# Patient Record
Sex: Male | Born: 1945 | Race: White | Hispanic: No | Marital: Married | Smoking: Former smoker
Health system: Southern US, Community
[De-identification: ages and names within clinical notes are randomized; demographics above are authoritative.]

## PROBLEM LIST (undated history)

## (undated) DIAGNOSIS — Z87442 Personal history of urinary calculi: Secondary | ICD-10-CM

## (undated) DIAGNOSIS — M1990 Unspecified osteoarthritis, unspecified site: Secondary | ICD-10-CM

## (undated) DIAGNOSIS — E119 Type 2 diabetes mellitus without complications: Secondary | ICD-10-CM

## (undated) DIAGNOSIS — I10 Essential (primary) hypertension: Secondary | ICD-10-CM

## (undated) DIAGNOSIS — C801 Malignant (primary) neoplasm, unspecified: Secondary | ICD-10-CM

## (undated) DIAGNOSIS — I499 Cardiac arrhythmia, unspecified: Secondary | ICD-10-CM

## (undated) HISTORY — PX: COLONOSCOPY: SHX174

## (undated) HISTORY — PX: CARDIAC CATHETERIZATION: SHX172

---

## 2010-02-16 ENCOUNTER — Ambulatory Visit: Payer: Self-pay | Admitting: Unknown Physician Specialty

## 2013-04-01 ENCOUNTER — Other Ambulatory Visit (HOSPITAL_COMMUNITY): Payer: Self-pay | Admitting: Urology

## 2013-04-01 DIAGNOSIS — C61 Malignant neoplasm of prostate: Secondary | ICD-10-CM

## 2013-04-11 ENCOUNTER — Ambulatory Visit (HOSPITAL_COMMUNITY)
Admission: RE | Admit: 2013-04-11 | Discharge: 2013-04-11 | Disposition: A | Discharge status: Home / Self Care | Payer: Medicare Other | Source: Ambulatory Visit | Attending: Urology | Admitting: Urology

## 2013-04-11 DIAGNOSIS — C61 Malignant neoplasm of prostate: Secondary | ICD-10-CM | POA: Insufficient documentation

## 2013-04-11 LAB — POCT I-STAT CREATININE: Creatinine, Ser: 0.9 mg/dL (ref 0.50–1.35)

## 2013-04-11 MED ORDER — GADOBENATE DIMEGLUMINE 529 MG/ML IV SOLN
17.0000 mL | Freq: Once | INTRAVENOUS | Status: AC | PRN
  Administered 2013-04-11: 17 mL via INTRAVENOUS

## 2013-05-06 ENCOUNTER — Other Ambulatory Visit: Payer: Self-pay | Admitting: Urology

## 2013-05-26 ENCOUNTER — Encounter (HOSPITAL_COMMUNITY): Payer: Self-pay | Admitting: Pharmacy Technician

## 2013-05-27 NOTE — Patient Instructions (Signed)
Jacob Young  05/27/2013   Your procedure is scheduled on:  06/05/13  1115am-215pm  Report to [facility] at      [place] AM.  Call this number if you have problems the morning of surgery: [phone]   Remember:   Do not eat food or drink liquids after midnight.   Take these medicines the morning of surgery with A SIP OF WATER:    Do not wear jewelry,   Do not wear lotions, powders, or perfumes..  . Men may shave face and neck.  Do not bring valuables to the hospital.  Contacts, dentures or bridgework may not be worn into surgery.  Leave suitcase in the car. After surgery it may be brought to your room.  For patients admitted to the hospital, checkout time is 11:00 AM the day of  discharge.      [name] - Preparing for Surgery Before surgery, you can play an important role.  Because skin is not sterile, your skin needs to be as free of germs as possible.  You can reduce the number of germs on your skin by washing with CHG (chlorahexidine gluconate) soap before surgery.  CHG is an antiseptic cleaner which kills germs and bonds with the skin to continue killing germs even after washing. Please DO NOT use if you have an allergy to CHG or antibacterial soaps.  If your skin becomes reddened/irritated stop using the CHG and inform your nurse when you arrive at Short Stay. Do not shave (including legs and underarms) for at least 48 hours prior to the first CHG shower.  You may shave your face. Please follow these instructions carefully:  1.  Shower with CHG Soap the night before surgery and the  morning of Surgery.  2.  If you choose to wash your hair, wash your hair first as usual with your  normal  shampoo.  3.  After you shampoo, rinse your hair and body thoroughly to remove the  shampoo.                           4.  Use CHG as you would any other liquid soap.  You can apply chg directly  to the skin and wash                       Gently with a scrungie or clean  washcloth.  5.  Apply the CHG Soap to your body ONLY FROM THE NECK DOWN.   Do not use on open                           Wound or open sores. Avoid contact with eyes, ears mouth and genitals (private parts).                        Genitals (private parts) with your normal soap.             6.  Wash thoroughly, paying special attention to the area where your surgery  will be performed.  7.  Thoroughly rinse your body with warm water from the neck down.  8.  DO NOT shower/wash with your normal soap after using and rinsing off  the CHG Soap.                9.  Pat yourself dry with a clean towel.  10.  Wear clean pajamas.            11.  Place clean sheets on your bed the night of your first shower and do not  sleep with pets. Day of Surgery : Do not apply any lotions/deodorants the morning of surgery.  Please wear clean clothes to the hospital/surgery center.  FAILURE TO FOLLOW THESE INSTRUCTIONS MAY RESULT IN THE CANCELLATION OF YOUR SURGERY PATIENT SIGNATURE_________________________________  NURSE SIGNATURE__________________________________  ________________________________________________________________________  WHAT IS A BLOOD TRANSFUSION? Blood Transfusion Information  A transfusion is the replacement of blood or some of its parts. Blood is made up of multiple cells which provide different functions.  Red blood cells carry oxygen and are used for blood loss replacement.  White blood cells fight against infection.  Platelets control bleeding.  Plasma helps clot blood.  Other blood products are available for specialized needs, such as hemophilia or other clotting disorders. BEFORE THE TRANSFUSION  Who gives blood for transfusions?   Healthy volunteers who are fully evaluated to make sure their blood is safe. This is blood bank blood. Transfusion therapy is the safest it has ever been in the practice of medicine. Before blood is taken from a donor, a complete history is  taken to make sure that person has no history of diseases nor engages in risky social behavior (examples are intravenous drug use or sexual activity with multiple partners). The donor's travel history is screened to minimize risk of transmitting infections, such as malaria. The donated blood is tested for signs of infectious diseases, such as HIV and hepatitis. The blood is then tested to be sure it is compatible with you in order to minimize the chance of a transfusion reaction. If you or a relative donates blood, this is often done in anticipation of surgery and is not appropriate for emergency situations. It takes many days to process the donated blood. RISKS AND COMPLICATIONS Although transfusion therapy is very safe and saves many lives, the main dangers of transfusion include:   Getting an infectious disease.  Developing a transfusion reaction. This is an allergic reaction to something in the blood you were given. Every precaution is taken to prevent this. The decision to have a blood transfusion has been considered carefully by your caregiver before blood is given. Blood is not given unless the benefits outweigh the risks. AFTER THE TRANSFUSION  Right after receiving a blood transfusion, you will usually feel much better and more energetic. This is especially true if your red blood cells have gotten low (anemic). The transfusion raises the level of the red blood cells which carry oxygen, and this usually causes an energy increase.  The nurse administering the transfusion will monitor you carefully for complications. HOME CARE INSTRUCTIONS  No special instructions are needed after a transfusion. You may find your energy is better. Speak with your caregiver about any limitations on activity for underlying diseases you may have. SEEK MEDICAL CARE IF:   Your condition is not improving after your transfusion.  You develop redness or irritation at the intravenous (IV) site. SEEK IMMEDIATE  MEDICAL CARE IF:  Any of the following symptoms occur over the next 12 hours:  Shaking chills.  You have a temperature by mouth above 102 F (38.9 C), not controlled by medicine.  Chest, back, or muscle pain.  People around you feel you are not acting correctly or are confused.  Shortness of breath or difficulty breathing.  Dizziness and fainting.  You get a rash or develop  hives.  You have a decrease in urine output.  Your urine turns a dark color or changes to pink, red, or brown. Any of the following symptoms occur over the next 10 days:  You have a temperature by mouth above 102 F (38.9 C), not controlled by medicine.  Shortness of breath.  Weakness after normal activity.  The white part of the eye turns yellow (jaundice).  You have a decrease in the amount of urine or are urinating less often.  Your urine turns a dark color or changes to pink, red, or brown. Document Released: 12/31/1999 Document Revised: 03/27/2011 Document Reviewed: 08/19/2007 ExitCare Patient Information 2014 [place].  _______________________________________________________________________  Incentive Spirometer  An incentive spirometer is a tool that can help keep your lungs clear and active. This tool measures how well you are filling your lungs with each breath. Taking long deep breaths may help reverse or decrease the chance of developing breathing (pulmonary) problems (especially infection) following:  A long period of time when you are unable to move or be active. BEFORE THE PROCEDURE   If the spirometer includes an indicator to show your best effort, your nurse or respiratory therapist will set it to a desired goal.  If possible, sit up straight or lean slightly forward. Try not to slouch.  Hold the incentive spirometer in an upright position. INSTRUCTIONS FOR USE  1. Sit on the edge of your bed if possible, or sit up as far as you can in bed or on a chair. 2. Hold the  incentive spirometer in an upright position. 3. Breathe out normally. 4. Place the mouthpiece in your mouth and seal your lips tightly around it. 5. Breathe in slowly and as deeply as possible, raising the piston or the ball toward the top of the column. 6. Hold your breath for 3-5 seconds or for as long as possible. Allow the piston or ball to fall to the bottom of the column. 7. Remove the mouthpiece from your mouth and breathe out normally. 8. Rest for a few seconds and repeat Steps 1 through 7 at least 10 times every 1-2 hours when you are awake. Take your time and take a few normal breaths between deep breaths. 9. The spirometer may include an indicator to show your best effort. Use the indicator as a goal to work toward during each repetition. 10. After each set of 10 deep breaths, practice coughing to be sure your lungs are clear. If you have an incision (the cut made at the time of surgery), support your incision when coughing by placing a pillow or rolled up towels firmly against it. Once you are able to get out of bed, walk around indoors and cough well. You may stop using the incentive spirometer when instructed by your caregiver.  RISKS AND COMPLICATIONS  Take your time so you do not get dizzy or light-headed.  If you are in pain, you may need to take or ask for pain medication before doing incentive spirometry. It is harder to take a deep breath if you are having pain. AFTER USE  Rest and breathe slowly and easily.  It can be helpful to keep track of a log of your progress. Your caregiver can provide you with a simple table to help with this. If you are using the spirometer at home, follow these instructions: [place] IF:   You are having difficultly using the spirometer.  You have trouble using the spirometer as often as instructed.  Your  pain medication is not giving enough relief while using the spirometer.  You develop fever of 100.5 F (38.1 C) or  higher. SEEK IMMEDIATE MEDICAL CARE IF:   You cough up bloody sputum that had not been present before.  You develop fever of 102 F (38.9 C) or greater.  You develop worsening pain at or near the incision site. MAKE SURE YOU:   Understand these instructions.  Will watch your condition.  Will get help right away if you are not doing well or get worse. Document Released: 05/15/2006 Document Revised: 03/27/2011 Document Reviewed: 07/16/2006 [facility] Patient Information 2014 [place], [place].   ________________________________________________________________________

## 2013-05-28 ENCOUNTER — Encounter (INDEPENDENT_AMBULATORY_CARE_PROVIDER_SITE_OTHER): Payer: Self-pay

## 2013-05-28 ENCOUNTER — Encounter (HOSPITAL_COMMUNITY): Payer: Self-pay

## 2013-05-28 ENCOUNTER — Encounter (HOSPITAL_COMMUNITY)
Admission: RE | Admit: 2013-05-28 | Discharge: 2013-05-28 | Disposition: A | Discharge status: Home / Self Care | Payer: Medicare Other | Source: Ambulatory Visit | Attending: Urology | Admitting: Urology

## 2013-05-28 DIAGNOSIS — Z01812 Encounter for preprocedural laboratory examination: Secondary | ICD-10-CM | POA: Insufficient documentation

## 2013-05-28 HISTORY — DX: Unspecified osteoarthritis, unspecified site: M19.90

## 2013-05-28 HISTORY — DX: Type 2 diabetes mellitus without complications: E11.9

## 2013-05-28 HISTORY — DX: Cardiac arrhythmia, unspecified: I49.9

## 2013-05-28 HISTORY — DX: Malignant (primary) neoplasm, unspecified: C80.1

## 2013-05-28 HISTORY — DX: Essential (primary) hypertension: I10

## 2013-05-28 LAB — CBC
HCT: 51.4 % (ref 39.0–52.0)
HEMOGLOBIN: 18 g/dL — AB (ref 13.0–17.0)
MCH: 32.4 pg (ref 26.0–34.0)
MCHC: 35 g/dL (ref 30.0–36.0)
MCV: 92.4 fL (ref 78.0–100.0)
Platelets: 156 10*3/uL (ref 150–400)
RBC: 5.56 MIL/uL (ref 4.22–5.81)
RDW: 12.2 % (ref 11.5–15.5)
WBC: 5.3 10*3/uL (ref 4.0–10.5)

## 2013-05-28 LAB — BASIC METABOLIC PANEL
BUN: 16 mg/dL (ref 6–23)
CALCIUM: 10.4 mg/dL (ref 8.4–10.5)
CO2: 30 meq/L (ref 19–32)
Chloride: 103 mEq/L (ref 96–112)
Creatinine, Ser: 0.7 mg/dL (ref 0.50–1.35)
GFR calc Af Amer: >90 mL/min (ref >90)
GFR calc non Af Amer: >90 mL/min (ref >90)
GLUCOSE: 131 mg/dL — AB (ref 70–99)
Potassium: 4.8 mEq/L (ref 3.7–5.3)
Sodium: 144 mEq/L (ref 137–147)

## 2013-05-28 NOTE — Progress Notes (Signed)
Wife has the beginnings of ALS per patient.  Sister- Jayanth Szczesniak will be his driver home upon discharge.

## 2013-05-28 NOTE — Progress Notes (Signed)
01/2813 last office visit with Dr Jacklyn Shell on chart  EKG 02/10/13 and CXR 02/10/13 on chart  Stress 11/29/11 on chart

## 2013-06-04 NOTE — H&P (Signed)
Chief Complaint Prostate Cancer   Reason For Visit Reason for consult: To discuss treatment options for prostate cancer and specifically to consider a robotic prostatectomy.  Physician requesting consult: Dr. Carolan Clines  PCP: Dr. Ronnald Collum   History of Present Illness Mr. Whitford is a 68 year old initially seen by Dr. Gaynelle Arabian in 2013 for an elevated PSA of 5.7. He was recommended to follow up for repeat testing and evaluation but did not return to the clinic as recommended. He returned again in February 2015 when his PSA was found to have further increased to 10.21. He underwent a prostate needle biopsy on 03/25/13 which demonstrated Gleason 3+3=6 adenocarcinoma of the prostate in 7 out of 12 biopsy cores. He underwent an MRI of the prostate on 04/11/13 which demonstrated a 9 mm lesion at the left lateral gland and 4 mm area toward the right lateral mid gland consistent with tumor but no EPE, SVI, or LAD. He has five paternal uncles and a grandfather who have had prostate cancer. He has considered his treatment options and been counseled by Dr. Gaynelle Arabian and is most interested in surgical treatment.    His medical comorbidities include a history of diabetes, hypertension, and dyslipidemia. He is the primary caretaker for his wife who was diagnosed with ALS about two years ago.     TNM stage: cT1c N0 Mx  PSA: 10.21  Gleason score: 3+3=6  Biopsy (03/25/13): 7/12 cores positive    Left: L lateral apex (20%), L apex (10%), L lateral mid (30%), L lateral base (10%)    Right: R mid (60%), R lateral mid (60%), R base (5%)  Prostate volume: 42.1 cc    Urinary function: He does take Invokana for his diabetes resulting in frequency. IPSS is 9.  Erectile function: He is not sexually active considering his wife's medical situation. This is currently a low priority. He states that he did have mild erectile dysfunction a few years ago prior to his wife's illness and did try Viagra  with effective results.   Past Medical History Problems  1. History of Arthritis (V13.4) 2. History of diabetes mellitus (V12.29) 3. History of hypercholesterolemia (V12.29) 4. History of hypertension (V12.59) 5. History of kidney stones (V13.01) 6. History of Skin Cancer (V10.83)  Surgical History Problems  1. History of No Surgical Problems  Current Meds 1. Alendronate Sodium 70 MG Oral Tablet;  Therapy: [id number] to Recorded 2. Aspirin 81 MG Oral Tablet;  Therapy: (Recorded:20Dec2013) to Recorded 3. GlipiZIDE ER 10 MG Oral Tablet Extended Release 24 Hour;  Therapy: 26Jan2015 to Recorded 4. Invokana 300 MG Oral Tablet;  Therapy: 21Jan2015 to Recorded 5. Lantus SOLN;  Therapy: (Recorded:20Dec2013) to Recorded 6. NovoLOG Mix 70/30 SUSP;  Therapy: (Recorded:20Dec2013) to Recorded 7. PARoxetine HCl - 40 MG Oral Tablet;  Therapy: (Recorded:20Dec2013) to Recorded 8. Toprol XL 25 MG Oral Tablet Extended Release 24 Hour;  Therapy: (Recorded:20Dec2013) to Recorded 9. Vytorin 10-40 MG Oral Tablet;  Therapy: (Recorded:20Dec2013) to Recorded  Allergies Medication  1. No Known Drug Allergies  Family History Problems  1. Family history of Family Health Status Number Of Children   1 son, 1 daughter 2. Family history of prostate carcinoma (V16.42) : Paternal Uncle 3. Family history of Father Deceased At Age 29 4. Family history of Mother Deceased At Age 62 5. Family history of Multiple Sclerosis : Father 58. Family history of Nephrolithiasis : Father 35. Family history of Renal Failure : Son 95. Family history of Stroke Syndrome (V17.1) : Mother  Social History Problems    Denied: History of Alcohol Use   Caffeine Use   3 per day   Former smoker (V15.82)   2 ppd x  48yrs, quit 45yrs ago   Marital History - Currently Married   Occupation: Retired  He quit smoking in 1981.   Review of Systems Genitourinary, constitutional, skin, eye, otolaryngeal,  hematologic/lymphatic, cardiovascular, pulmonary, endocrine, musculoskeletal, gastrointestinal, neurological and psychiatric system(s) were reviewed and pertinent findings if present are noted.  Genitourinary: no hematuria.  Constitutional: no fever and not feeling tired (fatigue).  Cardiovascular: no chest pain and no leg swelling.  Respiratory: no shortness of breath and no shortness of breath during exertion.    Vitals Vital Signs [Data Includes: Last 1 Day]  Recorded: 21Apr2015 08:18AM  Weight: 185 lb  BMI Calculated: 26.54 BSA Calculated: 2.02 Blood Pressure: 137 / 61 Heart Rate: 73  Physical Exam Constitutional: Well nourished and well developed . No acute distress.  ENT:. The ears and nose are normal in appearance.  Neck: The appearance of the neck is normal and no neck mass is present.  Pulmonary: No respiratory distress, normal respiratory rhythm and effort and clear bilateral breath sounds.  Cardiovascular: Heart rate and rhythm are normal . No peripheral edema.  Abdomen: The abdomen is soft and nontender. No masses are palpated. No CVA tenderness. No hernias are palpable. No hepatosplenomegaly noted.  Rectal: Rectal exam demonstrates normal sphincter tone, no tenderness and no masses. Prostate size is estimated to be 45 g. The prostate has no nodularity and is not tender. The left seminal vesicle is nonpalpable. The right seminal vesicle is nonpalpable. The perineum is normal on inspection.  Lymphatics: The supraclavicular, femoral and inguinal nodes are not enlarged or tender.  Skin: Normal skin turgor, no visible rash and no visible skin lesions.  Neuro/Psych:. Mood and affect are appropriate.    Results/Data Urine [Data Includes: Last 1 Day]   21Apr2015  COLOR YELLOW   APPEARANCE CLEAR   SPECIFIC GRAVITY 1.020   pH 5.5   GLUCOSE > 1000 mg/dL  BILIRUBIN NEG   KETONE NEG mg/dL  BLOOD NEG   PROTEIN NEG mg/dL  UROBILINOGEN 0.2 mg/dL  NITRITE NEG   LEUKOCYTE ESTERASE  NEG    I have independently reviewed his medical records, PSA results, pathology report, and MRI of the prostate. Findings are as dictated above.   Assessment Assessed  1. Prostate cancer (185)  Plan Health Maintenance  1. UA With REFLEX; [Do Not Release]; Status:Complete;   Done: 21Apr2015 08:07AM Prostate cancer  2. Follow-up Schedule Surgery Office  Follow-up  Status: Complete  Done: 21Apr2015 3. PT/OT Referral Referral  Referral  Status: Hold For - PreCert,Date of Service,Physical  Therapy  Requested for: 22Apr2015  Discussion/Summary 1. Prostate cancer: We discussed options for treatment and I did recommend therapy of curative intent considering his disease parameters and life expectancy.   The patient was counseled about the natural history of prostate cancer and the standard treatment options that are available for prostate cancer. It was explained to him how his age and life expectancy, clinical stage, Gleason score, and PSA affect his prognosis, the decision to proceed with additional staging studies, as well as how that information influences recommended treatment strategies. We discussed the roles for active surveillance, radiation therapy, surgical therapy, androgen deprivation, as well as ablative therapy options for the treatment of prostate cancer as appropriate to his individual cancer situation. We discussed the risks and benefits of these options with regard  to their impact on cancer control and also in terms of potential adverse events, complications, and impact on quiality of life particularly related to urinary, bowel, and sexual function. The patient was encouraged to ask questions throughout the discussion today and all questions were answered to his stated satisfaction. In addition, the patient was provided with and/or directed to appropriate resources and literature for further education about prostate cancer and treatment options.   We discussed surgical therapy for  prostate cancer including the different available surgical approaches. We discussed, in detail, the risks and expectations of surgery with regard to cancer control, urinary control, and erectile function as well as the expected postoperative recovery process. Additional risks of surgery including but not limited to bleeding, infection, hernia formation, nerve damage, lymphocele formation, bowel/rectal injury potentially necessitating colostomy, damage to the urinary tract resulting in urine leakage, urethral stricture, and the cardiopulmonary risks such as myocardial infarction, stroke, death, venothromboembolism, etc. were explained. The risk of open surgical conversion for robotic/laparoscopic prostatectomy was also discussed.     After reviewing both surgical treatment options and radiotherapy options, he is most interested in surgical treatment. I did offer him a radiation oncology consultation but he declined. He would like to proceed with surgery and will be scheduled for a bilateral nerve sparing robotic-assisted laparoscopic radical prostatectomy and bilateral pelvic lymphadenectomy.    Cc: Dr. Carolan Clines  Dr. Ronnald Collum    SignaturesElectronically signed by : Raynelle Bring, M.D.; May 06 2013  9:43AM EST

## 2013-06-05 ENCOUNTER — Encounter (HOSPITAL_COMMUNITY): Payer: Medicare Other | Admitting: Anesthesiology

## 2013-06-05 ENCOUNTER — Encounter (HOSPITAL_COMMUNITY): Payer: Self-pay | Admitting: *Deleted

## 2013-06-05 ENCOUNTER — Inpatient Hospital Stay (HOSPITAL_COMMUNITY): Payer: Medicare Other | Admitting: Anesthesiology

## 2013-06-05 ENCOUNTER — Inpatient Hospital Stay (HOSPITAL_COMMUNITY)
Admission: RE | Admit: 2013-06-05 | Discharge: 2013-06-06 | DRG: 708 | Disposition: A | Discharge status: Home / Self Care | Payer: Medicare Other | Source: Ambulatory Visit | Attending: Urology | Admitting: Urology

## 2013-06-05 ENCOUNTER — Encounter (HOSPITAL_COMMUNITY)
Admission: RE | Disposition: A | Discharge status: Home / Self Care | Payer: Self-pay | Source: Ambulatory Visit | Attending: Urology

## 2013-06-05 DIAGNOSIS — E119 Type 2 diabetes mellitus without complications: Secondary | ICD-10-CM | POA: Diagnosis present

## 2013-06-05 DIAGNOSIS — C61 Malignant neoplasm of prostate: Principal | ICD-10-CM | POA: Diagnosis present

## 2013-06-05 DIAGNOSIS — Z87891 Personal history of nicotine dependence: Secondary | ICD-10-CM

## 2013-06-05 DIAGNOSIS — Z794 Long term (current) use of insulin: Secondary | ICD-10-CM

## 2013-06-05 DIAGNOSIS — E785 Hyperlipidemia, unspecified: Secondary | ICD-10-CM | POA: Diagnosis present

## 2013-06-05 DIAGNOSIS — I10 Essential (primary) hypertension: Secondary | ICD-10-CM | POA: Diagnosis present

## 2013-06-05 DIAGNOSIS — Z85828 Personal history of other malignant neoplasm of skin: Secondary | ICD-10-CM

## 2013-06-05 DIAGNOSIS — Z8042 Family history of malignant neoplasm of prostate: Secondary | ICD-10-CM

## 2013-06-05 DIAGNOSIS — Z7982 Long term (current) use of aspirin: Secondary | ICD-10-CM

## 2013-06-05 DIAGNOSIS — Z01812 Encounter for preprocedural laboratory examination: Secondary | ICD-10-CM

## 2013-06-05 DIAGNOSIS — N529 Male erectile dysfunction, unspecified: Secondary | ICD-10-CM | POA: Diagnosis present

## 2013-06-05 DIAGNOSIS — Z823 Family history of stroke: Secondary | ICD-10-CM

## 2013-06-05 DIAGNOSIS — I2510 Atherosclerotic heart disease of native coronary artery without angina pectoris: Secondary | ICD-10-CM | POA: Diagnosis present

## 2013-06-05 DIAGNOSIS — Z87442 Personal history of urinary calculi: Secondary | ICD-10-CM

## 2013-06-05 HISTORY — PX: LYMPHADENECTOMY: SHX5960

## 2013-06-05 HISTORY — PX: ROBOT ASSISTED LAPAROSCOPIC RADICAL PROSTATECTOMY: SHX5141

## 2013-06-05 LAB — GLUCOSE, CAPILLARY
GLUCOSE-CAPILLARY: 111 mg/dL — AB (ref 70–99)
GLUCOSE-CAPILLARY: 98 mg/dL (ref 70–99)
GLUCOSE-CAPILLARY: 104 mg/dL — AB (ref 70–99)
Glucose-Capillary: 96 mg/dL (ref 70–99)
Glucose-Capillary: 111 mg/dL — ABNORMAL HIGH (ref 70–99)

## 2013-06-05 LAB — TYPE AND SCREEN
ABO/RH(D): O NEG
ANTIBODY SCREEN: NEGATIVE

## 2013-06-05 LAB — HEMOGLOBIN AND HEMATOCRIT, BLOOD
HEMATOCRIT: 44.1 % (ref 39.0–52.0)
Hemoglobin: 15.4 g/dL (ref 13.0–17.0)

## 2013-06-05 LAB — ABO/RH: ABO/RH(D): O NEG

## 2013-06-05 SURGERY — ROBOTIC ASSISTED LAPAROSCOPIC RADICAL PROSTATECTOMY LEVEL 2
Anesthesia: General

## 2013-06-05 MED ORDER — LIDOCAINE HCL (PF) 2 % IJ SOLN
INTRAMUSCULAR | Status: DC | PRN
  Administered 2013-06-05: 75 mg via INTRADERMAL

## 2013-06-05 MED ORDER — INSULIN ASPART 100 UNIT/ML ~~LOC~~ SOLN: 0.0000 [IU] | SUBCUTANEOUS | Status: DC

## 2013-06-05 MED ORDER — LIDOCAINE HCL (CARDIAC) 20 MG/ML IV SOLN
INTRAVENOUS | Status: AC
  Filled 2013-06-05: qty 5

## 2013-06-05 MED ORDER — NEOSTIGMINE METHYLSULFATE 10 MG/10ML IV SOLN
INTRAVENOUS | Status: DC | PRN
  Administered 2013-06-05: 4 mg via INTRAVENOUS

## 2013-06-05 MED ORDER — GLYCOPYRROLATE 0.2 MG/ML IJ SOLN
INTRAMUSCULAR | Status: AC
  Filled 2013-06-05: qty 3

## 2013-06-05 MED ORDER — ONDANSETRON HCL 4 MG/2ML IJ SOLN
INTRAMUSCULAR | Status: AC
  Administered 2013-06-05: 4 mg
  Filled 2013-06-05: qty 2

## 2013-06-05 MED ORDER — POTASSIUM CHLORIDE IN NACL 20-0.45 MEQ/L-% IV SOLN
INTRAVENOUS | Status: DC
  Administered 2013-06-05 (×2): via INTRAVENOUS
  Filled 2013-06-05 (×13): qty 1000

## 2013-06-05 MED ORDER — MIDAZOLAM HCL 2 MG/2ML IJ SOLN
INTRAMUSCULAR | Status: AC
  Filled 2013-06-05: qty 2

## 2013-06-05 MED ORDER — CIPROFLOXACIN HCL 500 MG PO TABS: 500.0000 mg | ORAL_TABLET | Freq: Two times a day (BID) | ORAL | Status: DC

## 2013-06-05 MED ORDER — ONDANSETRON HCL 4 MG/2ML IJ SOLN
INTRAMUSCULAR | Status: DC | PRN
  Administered 2013-06-05: 4 mg via INTRAVENOUS

## 2013-06-05 MED ORDER — GLYCOPYRROLATE 0.2 MG/ML IJ SOLN
INTRAMUSCULAR | Status: DC | PRN
  Administered 2013-06-05: 0.6 mg via INTRAVENOUS

## 2013-06-05 MED ORDER — STERILE WATER FOR IRRIGATION IR SOLN
Status: DC | PRN
  Administered 2013-06-05: 3000 mL

## 2013-06-05 MED ORDER — PROPOFOL 10 MG/ML IV BOLUS
INTRAVENOUS | Status: DC | PRN
  Administered 2013-06-05: 150 mg via INTRAVENOUS

## 2013-06-05 MED ORDER — CISATRACURIUM BESYLATE (PF) 10 MG/5ML IV SOLN
INTRAVENOUS | Status: DC | PRN
  Administered 2013-06-05: 4 mg via INTRAVENOUS
  Administered 2013-06-05: 2 mg via INTRAVENOUS
  Administered 2013-06-05: 4 mg via INTRAVENOUS
  Administered 2013-06-05: 2 mg via INTRAVENOUS
  Administered 2013-06-05: 14 mg via INTRAVENOUS

## 2013-06-05 MED ORDER — CISATRACURIUM BESYLATE 20 MG/10ML IV SOLN
INTRAVENOUS | Status: AC
  Filled 2013-06-05: qty 10

## 2013-06-05 MED ORDER — FENTANYL CITRATE 0.05 MG/ML IJ SOLN
INTRAMUSCULAR | Status: AC
  Filled 2013-06-05: qty 5

## 2013-06-05 MED ORDER — HYDROMORPHONE HCL PF 1 MG/ML IJ SOLN: 0.2500 mg | INTRAMUSCULAR | Status: DC | PRN

## 2013-06-05 MED ORDER — PROPOFOL 10 MG/ML IV BOLUS
INTRAVENOUS | Status: AC
  Filled 2013-06-05: qty 20

## 2013-06-05 MED ORDER — PAROXETINE HCL 20 MG PO TABS
40.0000 mg | ORAL_TABLET | Freq: Every morning | ORAL | Status: DC
  Administered 2013-06-06: 40 mg via ORAL
  Filled 2013-06-05: qty 2

## 2013-06-05 MED ORDER — PROMETHAZINE HCL 25 MG/ML IJ SOLN: 6.2500 mg | INTRAMUSCULAR | Status: DC | PRN

## 2013-06-05 MED ORDER — METOPROLOL SUCCINATE ER 25 MG PO TB24
25.0000 mg | ORAL_TABLET | Freq: Two times a day (BID) | ORAL | Status: DC
  Administered 2013-06-05 – 2013-06-06 (×2): 25 mg via ORAL
  Filled 2013-06-05 (×3): qty 1

## 2013-06-05 MED ORDER — CEFAZOLIN SODIUM 1-5 GM-% IV SOLN
1.0000 g | Freq: Three times a day (TID) | INTRAVENOUS | Status: AC
  Administered 2013-06-05 – 2013-06-06 (×2): 1 g via INTRAVENOUS
  Filled 2013-06-05 (×2): qty 50

## 2013-06-05 MED ORDER — NEOSTIGMINE METHYLSULFATE 10 MG/10ML IV SOLN
INTRAVENOUS | Status: AC
  Filled 2013-06-05: qty 1

## 2013-06-05 MED ORDER — DIPHENHYDRAMINE HCL 50 MG/ML IJ SOLN
12.5000 mg | Freq: Four times a day (QID) | INTRAMUSCULAR | Status: DC | PRN
Start: 2013-06-05 — End: 2013-06-06

## 2013-06-05 MED ORDER — KETOROLAC TROMETHAMINE 15 MG/ML IJ SOLN
15.0000 mg | Freq: Four times a day (QID) | INTRAMUSCULAR | Status: DC
  Administered 2013-06-05 – 2013-06-06 (×4): 15 mg via INTRAVENOUS
  Filled 2013-06-05 (×6): qty 1

## 2013-06-05 MED ORDER — BUPIVACAINE-EPINEPHRINE 0.25% -1:200000 IJ SOLN
INTRAMUSCULAR | Status: DC | PRN
  Administered 2013-06-05: 30 mL

## 2013-06-05 MED ORDER — FENTANYL CITRATE 0.05 MG/ML IJ SOLN
INTRAMUSCULAR | Status: AC
  Filled 2013-06-05: qty 2

## 2013-06-05 MED ORDER — ONDANSETRON HCL 4 MG/2ML IJ SOLN
INTRAMUSCULAR | Status: AC
  Filled 2013-06-05: qty 2

## 2013-06-05 MED ORDER — HYDROMORPHONE HCL PF 2 MG/ML IJ SOLN
INTRAMUSCULAR | Status: AC
  Filled 2013-06-05: qty 1

## 2013-06-05 MED ORDER — ONDANSETRON HCL 4 MG/2ML IJ SOLN: 4.0000 mg | Freq: Four times a day (QID) | INTRAMUSCULAR | Status: DC | PRN

## 2013-06-05 MED ORDER — KETOROLAC TROMETHAMINE 30 MG/ML IJ SOLN: 15.0000 mg | Freq: Once | INTRAMUSCULAR | Status: DC | PRN

## 2013-06-05 MED ORDER — HYDROMORPHONE HCL PF 1 MG/ML IJ SOLN
INTRAMUSCULAR | Status: DC | PRN
  Administered 2013-06-05 (×4): 0.5 mg via INTRAVENOUS

## 2013-06-05 MED ORDER — LACTATED RINGERS IV SOLN
INTRAVENOUS | Status: DC | PRN
  Administered 2013-06-05: 11:00:00

## 2013-06-05 MED ORDER — MORPHINE SULFATE 2 MG/ML IJ SOLN: 2.0000 mg | INTRAMUSCULAR | Status: DC | PRN

## 2013-06-05 MED ORDER — MIDAZOLAM HCL 5 MG/5ML IJ SOLN
INTRAMUSCULAR | Status: DC | PRN
  Administered 2013-06-05: 2 mg via INTRAVENOUS

## 2013-06-05 MED ORDER — DIPHENHYDRAMINE HCL 12.5 MG/5ML PO ELIX: 12.5000 mg | ORAL_SOLUTION | Freq: Four times a day (QID) | ORAL | Status: DC | PRN

## 2013-06-05 MED ORDER — LACTATED RINGERS IV SOLN
INTRAVENOUS | Status: DC | PRN
  Administered 2013-06-05 (×3): via INTRAVENOUS

## 2013-06-05 MED ORDER — DOCUSATE SODIUM 100 MG PO CAPS
100.0000 mg | ORAL_CAPSULE | Freq: Two times a day (BID) | ORAL | Status: DC
  Administered 2013-06-05 – 2013-06-06 (×2): 100 mg via ORAL
  Filled 2013-06-05 (×3): qty 1

## 2013-06-05 MED ORDER — LACTATED RINGERS IV SOLN: INTRAVENOUS | Status: DC

## 2013-06-05 MED ORDER — SODIUM CHLORIDE 0.9 % IR SOLN
Status: DC | PRN
  Administered 2013-06-05: 1000 mL

## 2013-06-05 MED ORDER — EZETIMIBE-SIMVASTATIN 10-40 MG PO TABS
1.0000 | ORAL_TABLET | Freq: Every day | ORAL | Status: DC
  Administered 2013-06-05: 1 via ORAL
  Filled 2013-06-05 (×2): qty 1

## 2013-06-05 MED ORDER — HEPARIN SODIUM (PORCINE) 1000 UNIT/ML IJ SOLN
INTRAMUSCULAR | Status: AC
  Filled 2013-06-05: qty 1

## 2013-06-05 MED ORDER — FENTANYL CITRATE 0.05 MG/ML IJ SOLN
INTRAMUSCULAR | Status: DC | PRN
  Administered 2013-06-05 (×2): 50 ug via INTRAVENOUS
  Administered 2013-06-05: 100 ug via INTRAVENOUS
  Administered 2013-06-05 (×3): 50 ug via INTRAVENOUS

## 2013-06-05 MED ORDER — ACETAMINOPHEN 325 MG PO TABS: 650.0000 mg | ORAL_TABLET | ORAL | Status: DC | PRN

## 2013-06-05 MED ORDER — BUPIVACAINE-EPINEPHRINE (PF) 0.25% -1:200000 IJ SOLN
INTRAMUSCULAR | Status: AC
  Filled 2013-06-05: qty 30

## 2013-06-05 MED ORDER — SODIUM CHLORIDE 0.9 % IV BOLUS (SEPSIS)
1000.0000 mL | Freq: Once | INTRAVENOUS | Status: AC
  Administered 2013-06-05: 1000 mL via INTRAVENOUS

## 2013-06-05 MED ORDER — CEFAZOLIN SODIUM-DEXTROSE 2-3 GM-% IV SOLR
INTRAVENOUS | Status: AC
  Filled 2013-06-05: qty 50

## 2013-06-05 MED ORDER — HYDROCODONE-ACETAMINOPHEN 5-325 MG PO TABS: 1.0000 | ORAL_TABLET | Freq: Four times a day (QID) | ORAL | Status: DC | PRN

## 2013-06-05 MED ORDER — CEFAZOLIN SODIUM-DEXTROSE 2-3 GM-% IV SOLR
2.0000 g | INTRAVENOUS | Status: AC
  Administered 2013-06-05: 2 g via INTRAVENOUS

## 2013-06-05 SURGICAL SUPPLY — 43 items
CABLE HIGH FREQUENCY MONO STRZ (ELECTRODE) ×4 IMPLANT
CATH FOLEY 2WAY SLVR 18FR 30CC (CATHETERS) ×4 IMPLANT
CATH ROBINSON RED A/P 16FR (CATHETERS) ×4 IMPLANT
CATH ROBINSON RED A/P 8FR (CATHETERS) ×4 IMPLANT
CATH TIEMANN FOLEY 18FR 5CC (CATHETERS) ×4 IMPLANT
CHLORAPREP W/TINT 26ML (MISCELLANEOUS) ×4 IMPLANT
CLIP LIGATING HEM O LOK PURPLE (MISCELLANEOUS) ×8 IMPLANT
CLOTH BEACON ORANGE TIMEOUT ST (SAFETY) ×4 IMPLANT
COVER SURGICAL LIGHT HANDLE (MISCELLANEOUS) ×4 IMPLANT
COVER TIP SHEARS 8 DVNC (MISCELLANEOUS) ×2 IMPLANT
COVER TIP SHEARS 8MM DA VINCI (MISCELLANEOUS) ×2
CUTTER ECHEON FLEX ENDO 45 340 (ENDOMECHANICALS) ×4 IMPLANT
DECANTER SPIKE VIAL GLASS SM (MISCELLANEOUS) ×4 IMPLANT
DERMABOND ADVANCED (GAUZE/BANDAGES/DRESSINGS) ×2
DERMABOND ADVANCED .7 DNX12 (GAUZE/BANDAGES/DRESSINGS) ×2 IMPLANT
DRAPE SURG IRRIG POUCH 19X23 (DRAPES) ×4 IMPLANT
DRSG TEGADERM 4X4.75 (GAUZE/BANDAGES/DRESSINGS) ×4 IMPLANT
DRSG TEGADERM 6X8 (GAUZE/BANDAGES/DRESSINGS) ×8 IMPLANT
ELECT REM PT RETURN 9FT ADLT (ELECTROSURGICAL) ×4
ELECTRODE REM PT RTRN 9FT ADLT (ELECTROSURGICAL) ×2 IMPLANT
GLOVE BIO SURGEON STRL SZ 6.5 (GLOVE) ×3 IMPLANT
GLOVE BIO SURGEONS STRL SZ 6.5 (GLOVE) ×1
GLOVE BIOGEL M STRL SZ7.5 (GLOVE) ×8 IMPLANT
GOWN STRL NON-REIN LRG LVL3 (GOWN DISPOSABLE) ×4 IMPLANT
GOWN STRL REUS W/ TWL LRG LVL3 (GOWN DISPOSABLE) ×2 IMPLANT
GOWN STRL REUS W/TWL LRG LVL3 (GOWN DISPOSABLE) ×14 IMPLANT
HEMOSTAT SURGICEL 4X8 (HEMOSTASIS) ×4 IMPLANT
HOLDER FOLEY CATH W/STRAP (MISCELLANEOUS) ×4 IMPLANT
KIT ACCESSORY DA VINCI DISP (KITS) ×2
KIT ACCESSORY DVNC DISP (KITS) ×2 IMPLANT
MANIFOLD NEPTUNE II (INSTRUMENTS) ×4 IMPLANT
NDL SAFETY ECLIPSE 18X1.5 (NEEDLE) ×2 IMPLANT
NEEDLE HYPO 18GX1.5 SHARP (NEEDLE) ×2
PACK ROBOT UROLOGY CUSTOM (CUSTOM PROCEDURE TRAY) ×4 IMPLANT
RELOAD GREEN ECHELON 45 (STAPLE) ×4 IMPLANT
SET TUBE IRRIG SUCTION NO TIP (IRRIGATION / IRRIGATOR) ×4 IMPLANT
SOLUTION ELECTROLUBE (MISCELLANEOUS) ×4 IMPLANT
SUT ETHILON 3 0 PS 1 (SUTURE) ×4 IMPLANT
SUT MNCRL AB 4-0 PS2 18 (SUTURE) ×8 IMPLANT
SUT VICRYL 0 UR6 27IN ABS (SUTURE) ×8 IMPLANT
SYR 27GX1/2 1ML LL SAFETY (SYRINGE) ×4 IMPLANT
TOWEL OR 17X26 10 PK STRL BLUE (TOWEL DISPOSABLE) ×4 IMPLANT
TOWEL OR NON WOVEN STRL DISP B (DISPOSABLE) ×4 IMPLANT

## 2013-06-05 NOTE — Progress Notes (Signed)
Hgb. -15.4- Hct. -44.1- Results noted.

## 2013-06-05 NOTE — Transfer of Care (Signed)
Immediate Anesthesia Transfer of Care Note  Patient: Jacob Young  Procedure(s) Performed: Procedure(s): ROBOTIC ASSISTED LAPAROSCOPIC RADICAL PROSTATECTOMY LEVEL 2 (N/A) LYMPHADENECTOMY (Bilateral)  Patient Location: PACU  Anesthesia Type:General  Level of Consciousness: sedated  Airway & Oxygen Therapy: Patient Spontanous Breathing and snoring noted on respiration - Nasal airway 8 inserted left nares   Post-op Assessment: Report given to PACU RN, Post -op Vital signs reviewed and stable and blood noted right nares after nasal airway insertion - small amount blood suctioned from mouth.   Post vital signs: Reviewed and stable  Complications: No apparent anesthesia complications

## 2013-06-05 NOTE — Progress Notes (Signed)
Dr. Kalman Shan in- already aware of patient's condition since arrival to PACU

## 2013-06-05 NOTE — Progress Notes (Signed)
Hgb. And Hct. Drawn by lab. 

## 2013-06-05 NOTE — Anesthesia Preprocedure Evaluation (Addendum)
Anesthesia Evaluation  Patient identified by MRN, date of birth, ID band Patient awake    Reviewed: Allergy & Precautions, H&P , NPO status , Patient's Chart, lab work & pertinent test results  Airway Mallampati: II TM Distance: <3 FB Neck ROM: Full    Dental no notable dental hx.    Pulmonary neg pulmonary ROS, former smoker,  breath sounds clear to auscultation  Pulmonary exam normal       Cardiovascular hypertension, Pt. on medications and Pt. on home beta blockers + CAD Rhythm:Regular Rate:Normal  60% RCA  LBBB   Neuro/Psych negative neurological ROS  negative psych ROS   GI/Hepatic negative GI ROS, Neg liver ROS,   Endo/Other  diabetes, Insulin Dependent  Renal/GU negative Renal ROS  negative genitourinary   Musculoskeletal negative musculoskeletal ROS (+)   Abdominal   Peds negative pediatric ROS (+)  Hematology negative hematology ROS (+)   Anesthesia Other Findings   Reproductive/Obstetrics negative OB ROS                          Anesthesia Physical Anesthesia Plan  ASA: III  Anesthesia Plan: General   Post-op Pain Management:    Induction: Intravenous  Airway Management Planned: Oral ETT  Additional Equipment:   Intra-op Plan:   Post-operative Plan: Extubation in OR  Informed Consent: I have reviewed the patients History and Physical, chart, labs and discussed the procedure including the risks, benefits and alternatives for the proposed anesthesia with the patient or authorized representative who has indicated his/her understanding and acceptance.   Dental advisory given  Plan Discussed with: CRNA and Surgeon  Anesthesia Plan Comments:         Anesthesia Quick Evaluation

## 2013-06-05 NOTE — Progress Notes (Signed)
Patient ID: Jacob Young, male   DOB: 09-Apr-1945, 68 y.o.   MRN: [id number]  Post-op note  Subjective: The patient is doing well.  No complaints.  Objective: Vital signs in last 24 hours: Temp:  [97.6 F (36.4 C)-98.6 F (37 C)] 97.6 F (36.4 C) (05/21 1549) Pulse Rate:  [68-88] 78 (05/21 1549) Resp:  [8-20] 12 (05/21 1549) BP: (124-177)/(65-97) 139/67 mmHg (05/21 1549) SpO2:  [92 %-100 %] 94 % (05/21 1549)  Intake/Output from previous day:   Intake/Output this shift: Total I/O In: 3100 [I.V.:2100; IV Piggyback:1000] Out: 700 [Urine:200; Drains:150; Blood:350]  Physical Exam:  General: Alert and oriented. Abdomen: Soft, Nondistended. Incisions: Clean and dry.  Lab Results:  Recent Labs  06/05/13 1424  HGB 15.4  HCT 44.1    Assessment/Plan: POD#0   1) Continue to monitor 2) Ambulate, IS   Pryor Curia. MD   LOS: 0 days   Raynelle Bring 06/05/2013, 4:36 PM

## 2013-06-05 NOTE — Discharge Instructions (Signed)
1. Activity:  You are encouraged to ambulate frequently (about every hour during waking hours) to help prevent blood clots from forming in your legs or lungs.  However, you should not engage in any heavy lifting (> 10-15 lbs), strenuous activity, or straining. °2. Diet: You should continue a clear liquid diet until passing gas from below.  Once this occurs, you may advance your diet to a soft diet that would be easy to digest (i.e soups, scrambled eggs, mashed potatoes, etc.) for 24 hours just as you would if getting over a bad stomach flu.  If tolerating this diet well for 24 hours, you may then begin eating regular food.  It will be normal to have some amount of bloating, nausea, and abdominal discomfort intermittently. °3. Prescriptions:  You will be provided a prescription for pain medication to take as needed.  If your pain is not severe enough to require the prescription pain medication, you may take Tylenol instead.  You should also take an over the counter stool softener (Colace 100 mg twice daily) to avoid straining with bowel movements as the pain medication may constipate you. Finally, you will also be provided a prescription for an antibiotic to begin the day prior to your return visit in the office for catheter removal. °4. Catheter care: You will be taught how to take care of the catheter by the nursing staff prior to discharge from the hospital.  You may use both a leg bag and the larger bedside bag but it is recommended to at least use the bigger bedside bag at nighttime as the leg bag is small and will fill up overnight and also does not drain as well when lying flat. You may periodically feel a strong urge to void with the catheter in place.  This is a bladder spasm and most often can occur when having a bowel movement or when you are moving around. It is typically self-limited and usually will stop after a few minutes.  You may use some Vaseline or Neosporin around the tip of the catheter to  reduce friction at the tip of the penis. °5. Incisions: You may remove your dressing bandages the 2nd day after surgery.  You most likely will have a few small staples in each of the incisions and once the bandages are removed, the incisions may stay open to air.  You may start showering (not soaking or bathing in water) 48 hours after surgery and the incisions simply need to be patted dry after the shower.  No additional care is needed. °6. What to call us about: You should call the office ([phone]) if you develop fever > 101, persistent vomiting, or the catheter stops draining. Also, feel free to call with any other questions you may have and remember the handout that was provided to you as a reference preoperatively which answers many of the common questions that arise after surgery. ° °You may resume aspirin, advil, aleve, vitamins, and supplements 7 days after surgery. °

## 2013-06-05 NOTE — Anesthesia Postprocedure Evaluation (Signed)
  Anesthesia Post-op Note  Patient: Jacob Young  Procedure(s) Performed: Procedure(s) (LRB): ROBOTIC ASSISTED LAPAROSCOPIC RADICAL PROSTATECTOMY LEVEL 2 (N/A) LYMPHADENECTOMY (Bilateral)  Patient Location: PACU  Anesthesia Type: General  Level of Consciousness: awake and alert   Airway and Oxygen Therapy: Patient Spontanous Breathing  Post-op Pain: mild  Post-op Assessment: Post-op Vital signs reviewed, Patient's Cardiovascular Status Stable, Respiratory Function Stable, Patent Airway and No signs of Nausea or vomiting  Last Vitals:  Filed Vitals:   06/05/13 1415  BP: 171/68  Pulse: 82  Temp:   Resp: 15    Post-op Vital Signs: stable   Complications: No apparent anesthesia complications

## 2013-06-05 NOTE — Progress Notes (Signed)
Utilization review completed.  

## 2013-06-05 NOTE — Interval H&P Note (Signed)
History and Physical Interval Note:  06/05/2013 10:07 AM  Jacob Young  has presented today for surgery, with the diagnosis of PROSTATE CANCER  The various methods of treatment have been discussed with the patient and family. After consideration of risks, benefits and other options for treatment, the patient has consented to  Procedure(s): ROBOTIC ASSISTED LAPAROSCOPIC RADICAL PROSTATECTOMY LEVEL 2 (N/A) LYMPHADENECTOMY (Bilateral) as a surgical intervention .  The patient's history has been reviewed, patient examined, no change in status, stable for surgery.  I have reviewed the patient's chart and labs.  Questions were answered to the patient's satisfaction.     Raynelle Bring

## 2013-06-05 NOTE — Op Note (Signed)
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 Robotic assisted laparoscopic radical prostatectomy (bilateral nerve sparing) 2. Bilateral robotic assisted laparoscopic pelvic lymphadenectomy  Surgeon: Pryor Curia. M.D.  Assistant: Leta Baptist, PA-C  Anesthesia: General  Complications: None  EBL: 350 mL  IVF:  2000 mL crystalloid  Specimens: 1. Prostate and seminal vesicles 2. Right pelvic lymph nodes 3. Left pelvic lymph nodes  Disposition of specimens: Pathology  Drains: 1. 20 Fr coude catheter 2. # 19 Blake pelvic drain  Indication: Jacob Young is a 68 y.o. year old patient with clinically localized prostate cancer.  After a thorough review of the management options for treatment of prostate cancer, he elected to proceed with surgical therapy and the above procedure(s).  We have discussed the potential benefits and risks of the procedure, side effects of the proposed treatment, the likelihood of the patient achieving the goals of the procedure, and any potential problems that might occur during the procedure or recuperation. Informed consent has been obtained.  Description of procedure:  The patient was taken to the operating room and a general anesthetic was administered. He was given preoperative antibiotics, placed in the dorsal lithotomy position, and prepped and draped in the usual sterile fashion. Next a preoperative timeout was performed. A urethral catheter was placed into the bladder and a site was selected near the umbilicus for placement of the camera port. This was placed using a standard open Hassan technique which allowed entry into the peritoneal cavity under direct vision and without difficulty. A 12 mm port was placed and a pneumoperitoneum established. The camera was  then used to inspect the abdomen and there was no evidence of any intra-abdominal injuries or other abnormalities. The remaining abdominal ports were then placed. 8 mm robotic ports were placed in the right lower quadrant, left lower quadrant, and far left lateral abdominal wall. A 5 mm port was placed in the right upper quadrant and a 12 mm port was placed in the right lateral abdominal wall for laparoscopic assistance. All ports were placed under direct vision without difficulty. The surgical cart was then docked.   Utilizing the cautery scissors, the bladder was reflected posteriorly allowing entry into the space of Retzius and identification of the endopelvic fascia and prostate. The periprostatic fat was then removed from the prostate allowing full exposure of the endopelvic fascia. The endopelvic fascia was then incised from the apex back to the base of the prostate bilaterally and the underlying levator muscle fibers were swept laterally off the prostate thereby isolating the dorsal venous complex. The dorsal vein was then stapled and divided with a 45 mm Flex Echelon stapler. Attention then turned to the bladder neck which was divided anteriorly thereby allowing entry into the bladder and exposure of the urethral catheter. The catheter balloon was deflated and the catheter was brought into the operative field and used to retract the prostate anteriorly. The posterior bladder neck was then examined and was divided allowing further dissection between the bladder and prostate posteriorly until the vasa deferentia and seminal vessels were identified. The vasa deferentia were isolated, divided, and lifted anteriorly. The seminal vesicles were dissected down to their tips with care to control the seminal vascular arterial blood supply. These structures were then lifted anteriorly and the space between Denonvillier's fascia and the anterior rectum was developed with a combination of  sharp and blunt dissection.  This isolated the vascular pedicles of the prostate.  The lateral prostatic fascia was then sharply incised allowing release of the neurovascular bundles bilaterally. The vascular pedicles of the prostate were then ligated with Weck clips between the prostate and neurovascular bundles and divided with sharp cold scissor dissection resulting in neurovascular bundle preservation. The neurovascular bundles were then separated off the apex of the prostate and urethra bilaterally.  The urethra was then sharply transected allowing the prostate specimen to be disarticulated. The pelvis was copiously irrigated and hemostasis was ensured. There was no evidence for rectal injury.  Attention then turned to the right pelvic sidewall. The fibrofatty tissue between the external iliac vein, confluence of the iliac vessels, hypogastric artery, and Cooper's ligament was dissected free from the pelvic sidewall with care to preserve the obturator nerve. Weck clips were used for lymphostasis and hemostasis. An identical procedure was performed on the contralateral side and the lymphatic packets were removed for permanent pathologic analysis.  Attention then turned to the urethral anastomosis. A 2-0 Vicryl slip knot was placed between Denonvillier's fascia, the posterior bladder neck, and the posterior urethra to reapproximate these structures. A double-armed 3-0 Monocryl suture was then used to perform a 360 running tension-free anastomosis between the bladder neck and urethra. A new urethral catheter was then placed into the bladder and irrigated. There were no blood clots within the bladder and the anastomosis appeared to be watertight. A #19 Blake drain was then brought through the left lateral 8 mm port site and positioned appropriately within the pelvis. It was secured to the skin with a nylon suture. The surgical cart was then undocked. The right lateral 12 mm port site was closed at the fascial level with a 0 Vicryl  suture placed laparoscopically. All remaining ports were then removed under direct vision. The prostate specimen was removed intact within the Endopouch retrieval bag via the periumbilical camera port site. This fascial opening was closed with two running 0 Vicryl sutures. 0.25% Marcaine was then injected into all port sites and all incisions were reapproximated at the skin level with 4-0 Monocryl subcuticular sutures and Dermabond. The patient appeared to tolerate the procedure well and without complications. The patient was able to be extubated and transferred to the recovery unit in satisfactory condition.   Pryor Curia MD

## 2013-06-06 ENCOUNTER — Encounter (HOSPITAL_COMMUNITY): Payer: Self-pay | Admitting: Urology

## 2013-06-06 LAB — HEMOGLOBIN AND HEMATOCRIT, BLOOD
HCT: 40.5 % (ref 39.0–52.0)
HEMOGLOBIN: 14 g/dL (ref 13.0–17.0)

## 2013-06-06 LAB — GLUCOSE, CAPILLARY
GLUCOSE-CAPILLARY: 81 mg/dL (ref 70–99)
Glucose-Capillary: 103 mg/dL — ABNORMAL HIGH (ref 70–99)
Glucose-Capillary: 76 mg/dL (ref 70–99)
Glucose-Capillary: 87 mg/dL (ref 70–99)

## 2013-06-06 MED ORDER — HYDROCODONE-ACETAMINOPHEN 5-325 MG PO TABS: 1.0000 | ORAL_TABLET | Freq: Four times a day (QID) | ORAL | Status: DC | PRN

## 2013-06-06 MED ORDER — BISACODYL 10 MG RE SUPP
10.0000 mg | Freq: Once | RECTAL | Status: AC
  Administered 2013-06-06: 10 mg via RECTAL
  Filled 2013-06-06: qty 1

## 2013-06-06 NOTE — Progress Notes (Signed)
Pt left at this time with his son and his sister. Leg bag and Foley teaching done. Supplies given. JP site dressing clean and intact.  Discharge instructions/prescriptions given/explained with pt verbalizing understanding. Followup appointment noted.

## 2013-06-06 NOTE — Progress Notes (Signed)
Patient ID: Jacob Young, male   DOB: Sep 16, 1945, 68 y.o.   MRN: [id number]  1 Day Post-Op Subjective: The patient is doing well.  No nausea or vomiting. Pain is adequately controlled.  Objective: Vital signs in last 24 hours: Temp:  [97.6 F (36.4 C)-98.6 F (37 C)] 98.1 F (36.7 C) (05/22 0500) Pulse Rate:  [68-88] 77 (05/22 0500) Resp:  [8-20] 20 (05/22 0500) BP: (124-177)/(65-97) 126/75 mmHg (05/22 0500) SpO2:  [92 %-100 %] 95 % (05/22 0500) Weight:  [82.101 kg (181 lb)] 82.101 kg (181 lb) (05/21 1549)  Intake/Output from previous day: 05/21 0701 - 05/22 0700 In: 4610 [P.O.:360; I.V.:3150; IV Piggyback:1100] Out: 2890 [Urine:2075; Drains:465; Blood:350] Intake/Output this shift: Total I/O In: 700 [I.V.:600; IV Piggyback:100] Out: 2025 [Urine:1875; Drains:150]  Physical Exam:  General: Alert and oriented. CV: RRR Lungs: Clear bilaterally. GI: Soft, Nondistended. Incisions: Clean, dry, and intact Urine: Clear Extremities: Nontender, no erythema, no edema.  Lab Results:  Recent Labs  06/05/13 1424 06/06/13 0323  HGB 15.4 14.0  HCT 44.1 40.5      Assessment/Plan: POD# 1 s/p robotic prostatectomy.  1) SL IVF 2) Ambulate, Incentive spirometry 3) Transition to oral pain medication 4) Dulcolax suppository 5) D/C pelvic drain 6) Plan for likely discharge later today   Pryor Curia. MD   LOS: 1 day   Raynelle Bring 06/06/2013, 6:59 AM

## 2013-06-06 NOTE — Discharge Summary (Signed)
  Date of admission: 06/05/2013  Date of discharge: 06/06/2013  Admission diagnosis: Prostate Cancer  Discharge diagnosis: Prostate Cancer  History and Physical: For full details, please see admission history and physical. Briefly, Jacob Young is a 68 y.o. gentleman with localized prostate cancer.  After discussing management/treatment options, he elected to proceed with surgical treatment.  Hospital Course: Jacob Young was taken to the operating room on 06/05/2013 and underwent a robotic assisted laparoscopic radical prostatectomy. He tolerated this procedure well and without complications. Postoperatively, he was able to be transferred to a regular hospital room following recovery from anesthesia.  He was able to begin ambulating the night of surgery. He remained hemodynamically stable overnight.  He had excellent urine output with appropriately minimal output from his pelvic drain and his pelvic drain was removed on POD #1.  He was transitioned to oral pain medication, tolerated a clear liquid diet, and had met all discharge criteria and was able to be discharged home later on POD#1.  Laboratory values:  Recent Labs  06/05/13 1424 06/06/13 0323  HGB 15.4 14.0  HCT 44.1 40.5    Disposition: Home  Discharge instruction: He was instructed to be ambulatory but to refrain from heavy lifting, strenuous activity, or driving. He was instructed on urethral catheter care.  Discharge medications:     Medication List    STOP taking these medications       aspirin EC 81 MG tablet      TAKE these medications       ciprofloxacin 500 MG tablet  Commonly known as:  CIPRO  Take 1 tablet (500 mg total) by mouth 2 (two) times daily. Start day prior to office visit for foley removal     ezetimibe-simvastatin 10-40 MG per tablet  Commonly known as:  VYTORIN  Take 1 tablet by mouth at bedtime.     HYDROcodone-acetaminophen 5-325 MG per tablet  Commonly known as:  NORCO  Take 1-2 tablets by  mouth every 6 (six) hours as needed.     insulin glargine 100 UNIT/ML injection  Commonly known as:  LANTUS  Inject 30-40 Units into the skin at bedtime.     INVOKANA 300 MG Tabs  Generic drug:  Canagliflozin  Take 1 tablet by mouth daily.     metoprolol succinate 25 MG 24 hr tablet  Commonly known as:  TOPROL-XL  Take 25 mg by mouth 2 (two) times daily.     PARoxetine 40 MG tablet  Commonly known as:  PAXIL  Take 40 mg by mouth every morning.        Followup: He will followup in 1 week for catheter removal and to discuss his surgical pathology results.

## 2013-09-30 ENCOUNTER — Emergency Department: Payer: Self-pay | Admitting: Emergency Medicine

## 2013-09-30 LAB — COMPREHENSIVE METABOLIC PANEL
ALK PHOS: 105 U/L
ALT: 25 U/L
Albumin: 3.4 g/dL (ref 3.4–5.0)
Anion Gap: 10 (ref 7–16)
BILIRUBIN TOTAL: 0.8 mg/dL (ref 0.2–1.0)
BUN: 19 mg/dL — ABNORMAL HIGH (ref 7–18)
CO2: 23 mmol/L (ref 21–32)
Calcium, Total: 8.8 mg/dL (ref 8.5–10.1)
Chloride: 109 mmol/L — ABNORMAL HIGH (ref 98–107)
Creatinine: 1.02 mg/dL (ref 0.60–1.30)
EGFR (African American): >60
EGFR (Non-African Amer.): >60
GLUCOSE: 229 mg/dL — AB (ref 65–99)
OSMOLALITY: 293 (ref 275–301)
POTASSIUM: 3.7 mmol/L (ref 3.5–5.1)
SGOT(AST): 34 U/L (ref 15–37)
SODIUM: 142 mmol/L (ref 136–145)
Total Protein: 6.2 g/dL — ABNORMAL LOW (ref 6.4–8.2)

## 2013-09-30 LAB — CBC
HCT: 50.5 % (ref 40.0–52.0)
HGB: 16.9 g/dL (ref 13.0–18.0)
MCH: 31.5 pg (ref 26.0–34.0)
MCHC: 33.5 g/dL (ref 32.0–36.0)
MCV: 94 fL (ref 80–100)
Platelet: 93 10*3/uL — ABNORMAL LOW (ref 150–440)
RBC: 5.37 10*6/uL (ref 4.40–5.90)
RDW: 12.6 % (ref 11.5–14.5)
WBC: 6.8 10*3/uL (ref 3.8–10.6)

## 2013-09-30 LAB — TROPONIN I

## 2013-09-30 LAB — LIPASE, BLOOD: Lipase: 96 U/L (ref 73–393)

## 2013-10-01 ENCOUNTER — Emergency Department: Payer: Self-pay | Admitting: Emergency Medicine

## 2013-10-01 LAB — URINALYSIS, COMPLETE
BILIRUBIN, UR: NEGATIVE
NITRITE: NEGATIVE
PH: 5 (ref 4.5–8.0)
SPECIFIC GRAVITY: 1.035 (ref 1.003–1.030)
Squamous Epithelial: 7
WBC UR: 75 /HPF (ref 0–5)

## 2013-10-04 LAB — URINE CULTURE

## 2015-08-04 IMAGING — CT CT ABD-PELV W/O CM
2 of 4 series · 16 of 46 positions shown, 18 images · non-contrast
Comparison: None.

CLINICAL DATA: Severe abdominal pain after eating chicken sandwich.
Nausea, vomiting. Left flank pain

EXAM:
CT ABDOMEN AND PELVIS WITHOUT CONTRAST
TECHNIQUE: Multidetector CT imaging of the abdomen and pelvis was performed
following the standard protocol without IV contrast.

[Series 2: stone standard full · axial · 0.77mm/px · z∈[-1146,-691]mm · 13 of 101 slices shown, 15 images]
[im 5/101  soft-tissue]
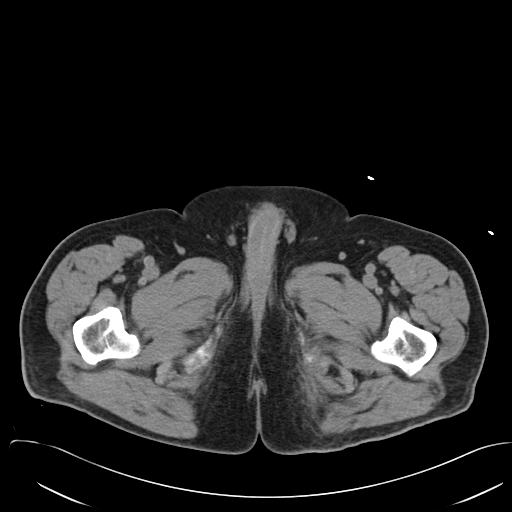
[im 5/101  bone]
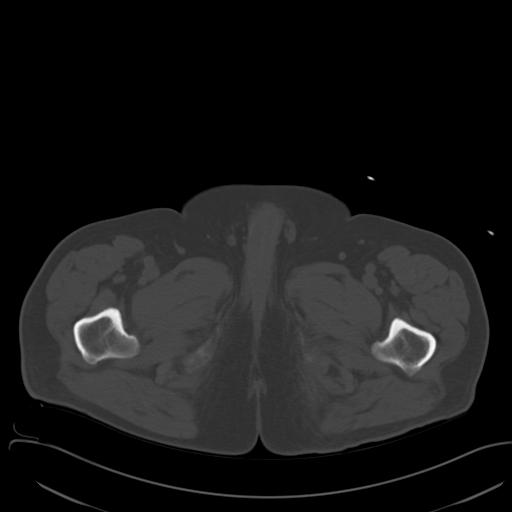
[im 14/101  soft-tissue]
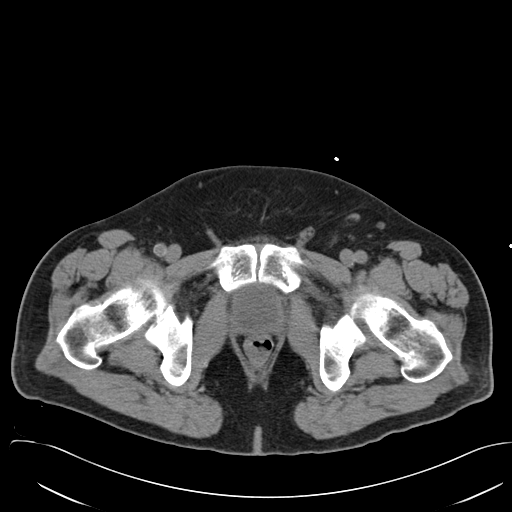
[im 22/101  soft-tissue]
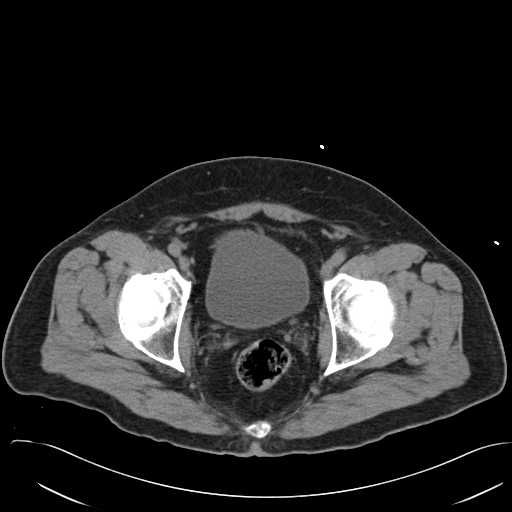
[im 27/101  soft-tissue]
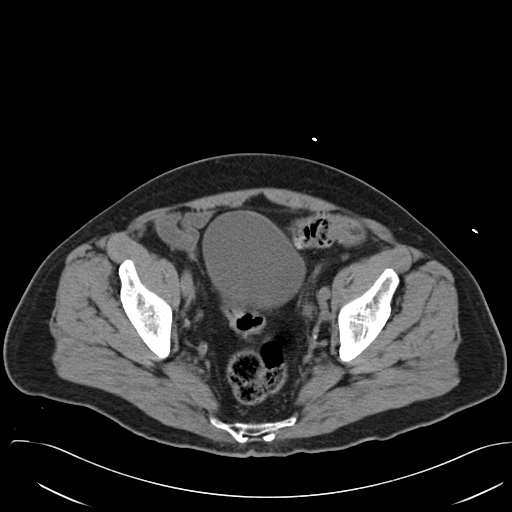
[im 35/101  soft-tissue]
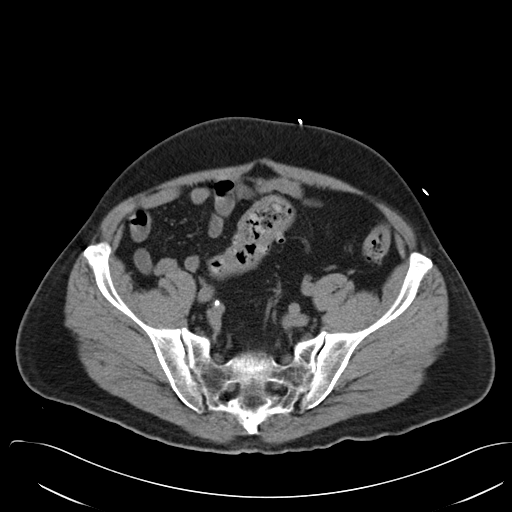
[im 44/101  soft-tissue]
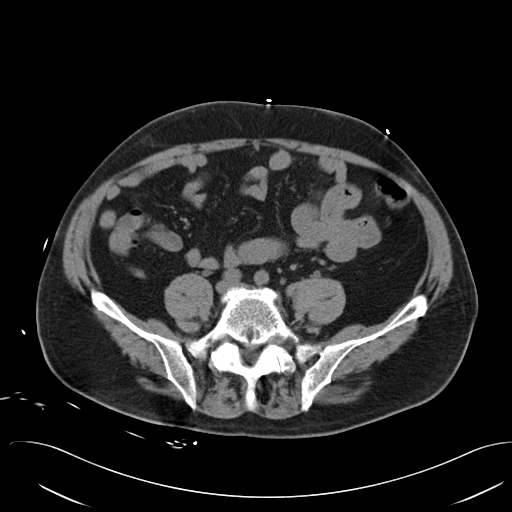
[im 53/101  soft-tissue]
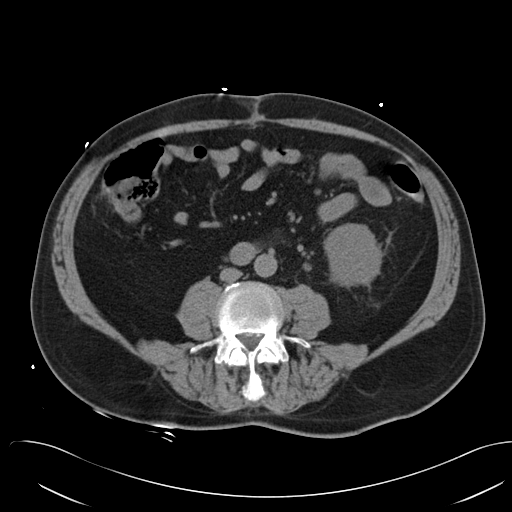
[im 57/101  soft-tissue]
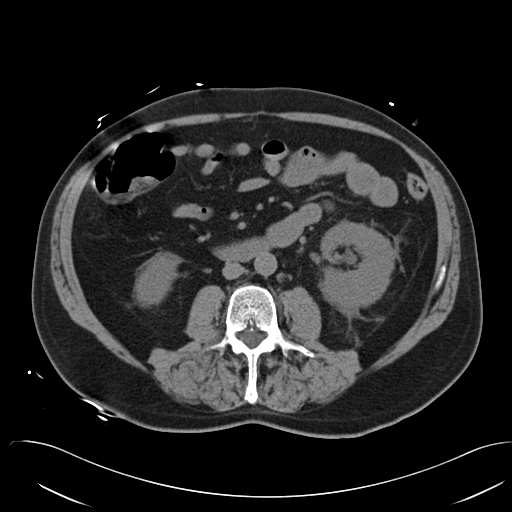
[im 66/101  soft-tissue]
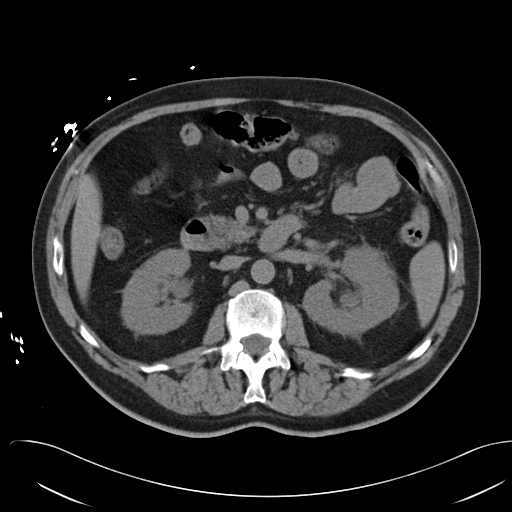
[im 66/101  bone]
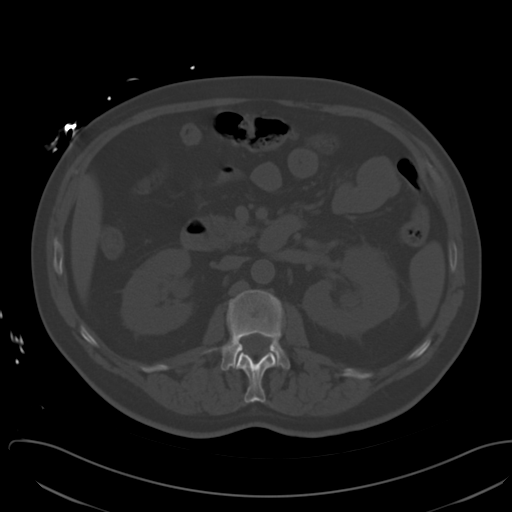
[im 74/101  soft-tissue]
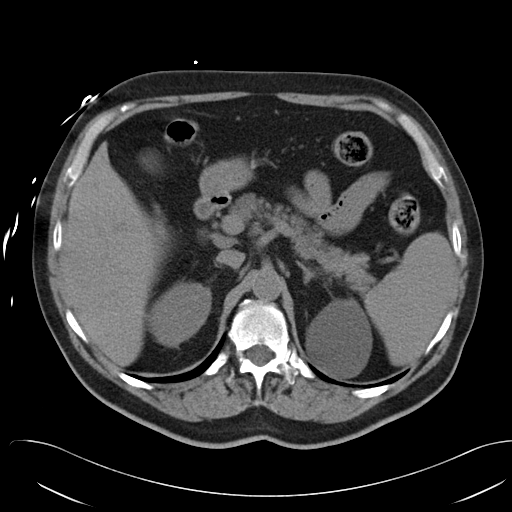
[im 79/101  soft-tissue]
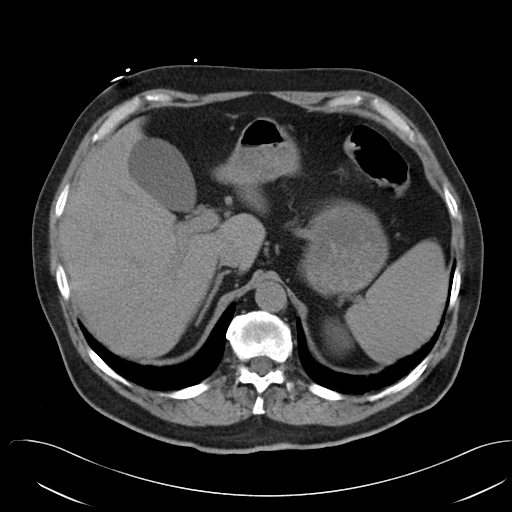
[im 87/101  soft-tissue]
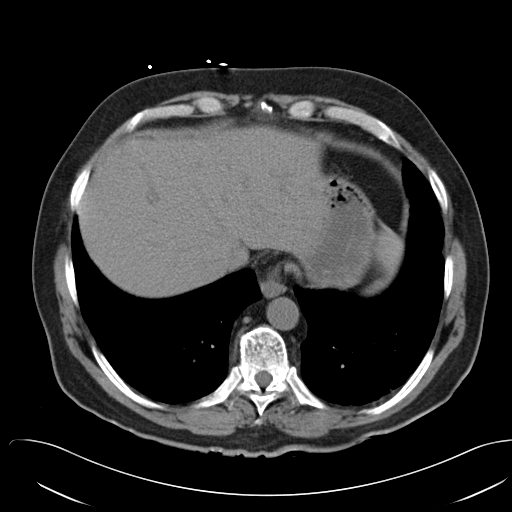
[im 96/101  soft-tissue]
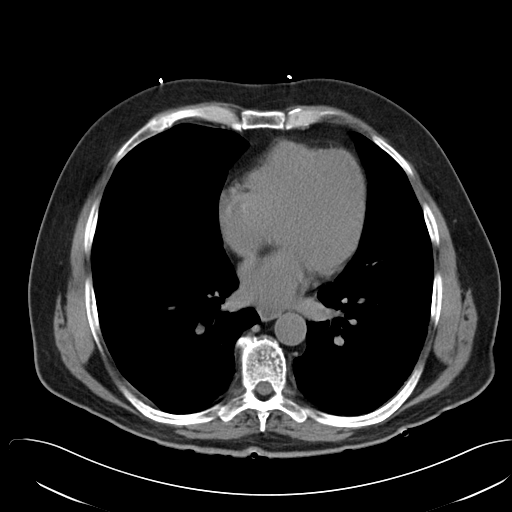

[Series 5: cor stone standard full · coronal · 0.76mm/px · 3 of 142 slices shown]
[im 48/142  soft-tissue]
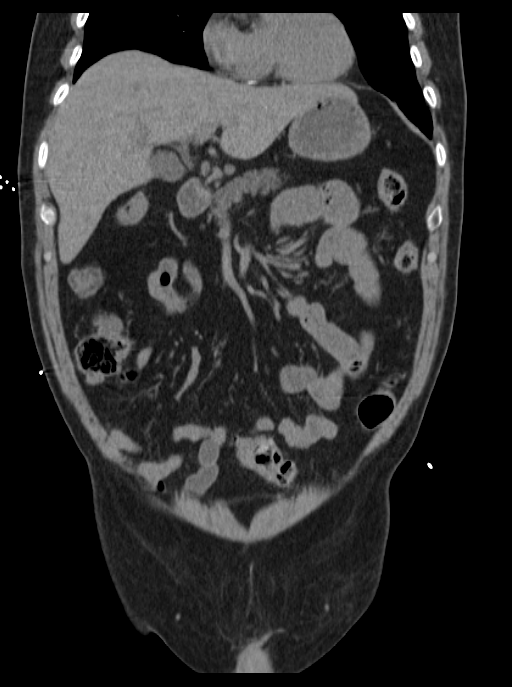
[im 63/142  soft-tissue]
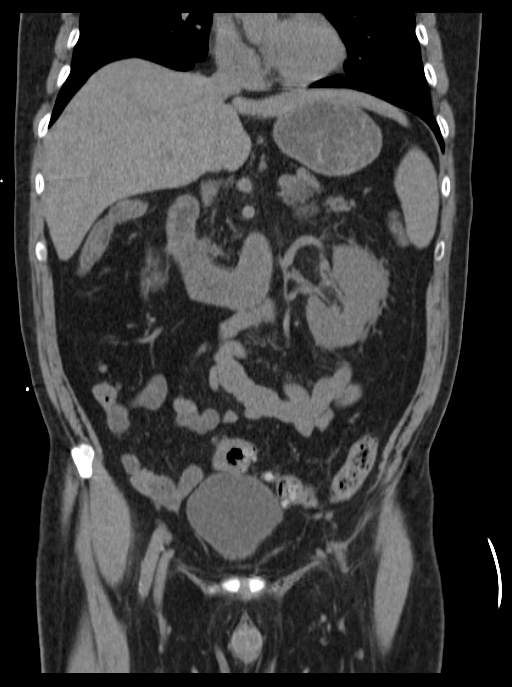
[im 79/142  soft-tissue]
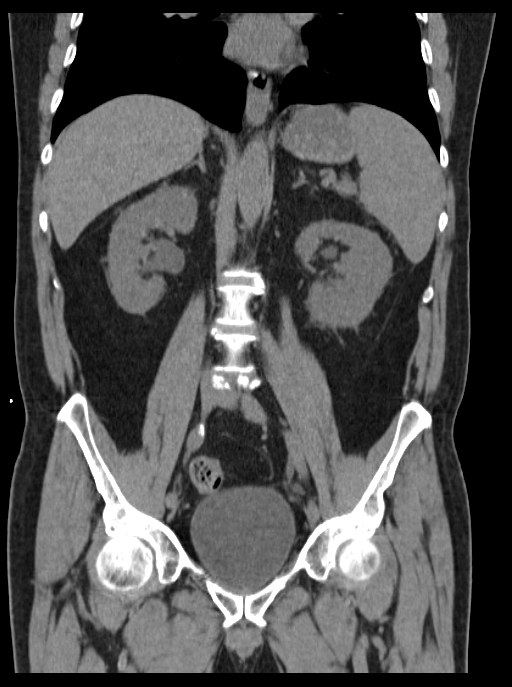

[16 of 46 positions shown; findings below may reference images not displayed]

FINDINGS: Lower chest: Small ventral pleural lymph node is identified within
the major fissure on the right. This measures 6 mm in diameter and
has benign features. No focal consolidations or pleural effusions
are identified. The heart size is normal.

Upper abdomen: There is left-sided hydronephrosis mild hydroureter.
A distal left ureteral calculus measures 2 mm just proximal to the
ureterovesical junction. There is a right upper pole cyst measuring
5.8 cm.

No focal abnormality identified within the liver, spleen, pancreas,
or adrenal glands. Gallbladder is present.

Bowel: The stomach and small bowel loops are normal in appearance.
The appendix is well seen and has a normal appearance. There are
numerous colonic diverticula. No evidence for acute diverticulitis.
Moderate stool burden is noted.

Pelvis: The urinary bladder, and seminal vesicles have a normal
appearance question of previous TURP. There is no free pelvic fluid.

Retroperitoneum: No retroperitoneal or mesenteric adenopathy. No
evidence for aortic aneurysm.

Abdominal wall: Unremarkable.

Osseous structures: There are degenerative changes in the
thoracolumbar spine. No suspicious lytic or blastic lesions.
IMPRESSION: 1. Distal left ureteral stone measures 2 mm and causes mild
hydronephrosis and hydroureter.
2. No evidence for bowel obstruction.
3. Normal appendix.
4. Diverticulosis without evidence for acute diverticulitis.
5. Question of previous TURP.

## 2022-03-21 ENCOUNTER — Other Ambulatory Visit: Payer: Self-pay | Admitting: Orthopedic Surgery

## 2022-03-21 DIAGNOSIS — M9973 Connective tissue and disc stenosis of intervertebral foramina of lumbar region: Secondary | ICD-10-CM

## 2022-03-26 ENCOUNTER — Ambulatory Visit
Admission: RE | Admit: 2022-03-26 | Discharge: 2022-03-26 | Disposition: A | Discharge status: Home / Self Care | Payer: Medicare PPO | Source: Ambulatory Visit | Attending: Orthopedic Surgery | Admitting: Orthopedic Surgery

## 2022-03-26 DIAGNOSIS — M9973 Connective tissue and disc stenosis of intervertebral foramina of lumbar region: Secondary | ICD-10-CM | POA: Insufficient documentation

## 2022-05-18 ENCOUNTER — Other Ambulatory Visit: Payer: Self-pay | Admitting: Sports Medicine

## 2022-05-18 DIAGNOSIS — W19XXXA Unspecified fall, initial encounter: Secondary | ICD-10-CM

## 2022-05-18 DIAGNOSIS — R29898 Other symptoms and signs involving the musculoskeletal system: Secondary | ICD-10-CM

## 2022-05-18 DIAGNOSIS — M6281 Muscle weakness (generalized): Secondary | ICD-10-CM

## 2022-05-18 DIAGNOSIS — M25562 Pain in left knee: Secondary | ICD-10-CM

## 2022-05-21 ENCOUNTER — Ambulatory Visit
Admission: RE | Admit: 2022-05-21 | Discharge: 2022-05-21 | Disposition: A | Discharge status: Home / Self Care | Payer: Medicare PPO | Source: Ambulatory Visit | Attending: Sports Medicine | Admitting: Sports Medicine

## 2022-05-21 DIAGNOSIS — M6281 Muscle weakness (generalized): Secondary | ICD-10-CM | POA: Diagnosis not present

## 2022-05-21 DIAGNOSIS — W19XXXA Unspecified fall, initial encounter: Secondary | ICD-10-CM | POA: Diagnosis not present

## 2022-05-21 DIAGNOSIS — Y939 Activity, unspecified: Secondary | ICD-10-CM | POA: Diagnosis not present

## 2022-05-21 DIAGNOSIS — G8918 Other acute postprocedural pain: Secondary | ICD-10-CM | POA: Diagnosis not present

## 2022-05-21 DIAGNOSIS — M25562 Pain in left knee: Secondary | ICD-10-CM | POA: Diagnosis not present

## 2022-05-21 DIAGNOSIS — R609 Edema, unspecified: Secondary | ICD-10-CM | POA: Diagnosis not present

## 2022-05-21 DIAGNOSIS — S8002XA Contusion of left knee, initial encounter: Secondary | ICD-10-CM | POA: Insufficient documentation

## 2022-05-21 DIAGNOSIS — R29898 Other symptoms and signs involving the musculoskeletal system: Secondary | ICD-10-CM | POA: Diagnosis not present

## 2022-05-21 DIAGNOSIS — Y929 Unspecified place or not applicable: Secondary | ICD-10-CM | POA: Insufficient documentation

## 2024-01-30 NOTE — Discharge Instructions (Signed)

## 2024-01-31 ENCOUNTER — Encounter: Payer: Self-pay | Admitting: Anesthesiology

## 2024-01-31 ENCOUNTER — Encounter: Admission: RE | Disposition: A | Payer: Self-pay | Source: Home / Self Care

## 2024-01-31 ENCOUNTER — Other Ambulatory Visit: Payer: Self-pay

## 2024-01-31 ENCOUNTER — Ambulatory Visit: Admission: RE | Admit: 2024-01-31 | Discharge: 2024-01-31 | Disposition: A | Discharge status: Home / Self Care

## 2024-01-31 ENCOUNTER — Ambulatory Visit: Payer: Self-pay | Admitting: Anesthesiology

## 2024-01-31 DIAGNOSIS — E1136 Type 2 diabetes mellitus with diabetic cataract: Secondary | ICD-10-CM | POA: Insufficient documentation

## 2024-01-31 DIAGNOSIS — H2511 Age-related nuclear cataract, right eye: Secondary | ICD-10-CM | POA: Insufficient documentation

## 2024-01-31 DIAGNOSIS — H25041 Posterior subcapsular polar age-related cataract, right eye: Secondary | ICD-10-CM | POA: Diagnosis present

## 2024-01-31 DIAGNOSIS — Z794 Long term (current) use of insulin: Secondary | ICD-10-CM | POA: Insufficient documentation

## 2024-01-31 DIAGNOSIS — Z5941 Food insecurity: Secondary | ICD-10-CM | POA: Diagnosis not present

## 2024-01-31 DIAGNOSIS — Z87891 Personal history of nicotine dependence: Secondary | ICD-10-CM | POA: Diagnosis not present

## 2024-01-31 DIAGNOSIS — C61 Malignant neoplasm of prostate: Secondary | ICD-10-CM

## 2024-01-31 DIAGNOSIS — I10 Essential (primary) hypertension: Secondary | ICD-10-CM | POA: Insufficient documentation

## 2024-01-31 DIAGNOSIS — Z59868 Other specified financial insecurity: Secondary | ICD-10-CM | POA: Diagnosis not present

## 2024-01-31 DIAGNOSIS — E119 Type 2 diabetes mellitus without complications: Secondary | ICD-10-CM

## 2024-01-31 HISTORY — DX: Personal history of urinary calculi: Z87.442

## 2024-01-31 HISTORY — PX: CATARACT EXTRACTION W/PHACO: SHX586

## 2024-01-31 LAB — GLUCOSE, CAPILLARY: Glucose-Capillary: 229 mg/dL — ABNORMAL HIGH (ref 70–99)

## 2024-01-31 MED ORDER — TRYPAN BLUE 0.06 % IO SOSY
PREFILLED_SYRINGE | INTRAOCULAR | Status: DC | PRN
  Administered 2024-01-31: .5 mL via INTRAOCULAR

## 2024-01-31 MED ORDER — GLYCOPYRROLATE 0.2 MG/ML IJ SOLN
INTRAMUSCULAR | Status: DC | PRN
  Administered 2024-01-31: .2 mg via INTRAVENOUS

## 2024-01-31 MED ORDER — FENTANYL CITRATE (PF) 100 MCG/2ML IJ SOLN
INTRAMUSCULAR | Status: DC | PRN
  Administered 2024-01-31: 50 ug via INTRAVENOUS

## 2024-01-31 MED ORDER — FENTANYL CITRATE (PF) 100 MCG/2ML IJ SOLN
INTRAMUSCULAR | Status: AC
  Filled 2024-01-31: qty 2

## 2024-01-31 MED ORDER — GLYCOPYRROLATE 0.2 MG/ML IJ SOLN
INTRAMUSCULAR | Status: AC
  Filled 2024-01-31: qty 1

## 2024-01-31 MED ORDER — BRIMONIDINE TARTRATE-TIMOLOL 0.2-0.5 % OP SOLN
OPHTHALMIC | Status: DC | PRN
  Administered 2024-01-31: 1 [drp] via OPHTHALMIC

## 2024-01-31 MED ORDER — CYCLOPENTOLATE HCL 2 % OP SOLN
1.0000 [drp] | OPHTHALMIC | Status: AC | PRN
  Administered 2024-01-31 (×3): 1 [drp] via OPHTHALMIC

## 2024-01-31 MED ORDER — MIDAZOLAM HCL (PF) 2 MG/2ML IJ SOLN
INTRAMUSCULAR | Status: DC | PRN
  Administered 2024-01-31 (×2): 1 mg via INTRAVENOUS

## 2024-01-31 MED ORDER — MIDAZOLAM HCL 2 MG/2ML IJ SOLN
INTRAMUSCULAR | Status: AC
  Filled 2024-01-31: qty 2

## 2024-01-31 MED ORDER — PHENYLEPHRINE HCL 10 % OP SOLN
1.0000 [drp] | OPHTHALMIC | Status: AC | PRN
  Administered 2024-01-31 (×3): 1 [drp] via OPHTHALMIC

## 2024-01-31 MED ORDER — MOXIFLOXACIN HCL 0.5 % OP SOLN
OPHTHALMIC | Status: DC | PRN
  Administered 2024-01-31: .2 mL via OPHTHALMIC

## 2024-01-31 MED ORDER — TETRACAINE HCL 0.5 % OP SOLN
OPHTHALMIC | Status: AC
  Filled 2024-01-31: qty 4

## 2024-01-31 MED ORDER — SIGHTPATH DOSE#1 NA HYALUR & NA CHOND-NA HYALUR IO KIT
PACK | INTRAOCULAR | Status: DC | PRN
  Administered 2024-01-31: 1 via OPHTHALMIC

## 2024-01-31 MED ORDER — PHENYLEPHRINE HCL 10 % OP SOLN
OPHTHALMIC | Status: AC
  Filled 2024-01-31: qty 5

## 2024-01-31 MED ORDER — LIDOCAINE HCL (PF) 2 % IJ SOLN
INTRAOCULAR | Status: DC | PRN
  Administered 2024-01-31: 1 mL via INTRAOCULAR

## 2024-01-31 MED ORDER — SIGHTPATH DOSE#1 NA CHONDROIT SULF-NA HYALURON 20-15 MG/0.5ML IO SOSY
INTRAOCULAR | Status: DC | PRN
  Administered 2024-01-31: .5 mL via INTRAOCULAR

## 2024-01-31 MED ORDER — TETRACAINE HCL 0.5 % OP SOLN
1.0000 [drp] | OPHTHALMIC | Status: DC | PRN
  Administered 2024-01-31 (×3): 1 [drp] via OPHTHALMIC

## 2024-01-31 MED ORDER — PHENYLEPHRINE-KETOROLAC 1-0.3 % IO SOLN
INTRAOCULAR | Status: DC | PRN
  Administered 2024-01-31: 169 mL via OPHTHALMIC

## 2024-01-31 MED ORDER — CYCLOPENTOLATE HCL 2 % OP SOLN
OPHTHALMIC | Status: AC
  Filled 2024-01-31: qty 2

## 2024-01-31 MED ORDER — SIGHTPATH DOSE#1 BSS IO SOLN
INTRAOCULAR | Status: DC | PRN
  Administered 2024-01-31 (×2): 15 mL via INTRAOCULAR

## 2024-01-31 NOTE — Transfer of Care (Signed)
 Immediate Anesthesia Transfer of Care Note  Patient: Jacob Young  Procedure(s) Performed: PHACOEMULSIFICATION, CATARACT, WITH IOL INSERTION 17017, 02:25.7 (Right)  Patient Location: PACU  Anesthesia Type: MAC  Level of Consciousness: awake, alert  and patient cooperative  Airway and Oxygen Therapy: Patient Spontanous Breathing and Patient connected to supplemental oxygen  Post-op Assessment: Post-op Vital signs reviewed, Patient's Cardiovascular Status Stable, Respiratory Function Stable, Patent Airway and No signs of Nausea or vomiting  Post-op Vital Signs: Reviewed and stable  Complications: No notable events documented.

## 2024-01-31 NOTE — H&P (Signed)
 [facility]   Primary Care Physician:  Christi Vannie PARAS, MD Ophthalmologist: Dr. Curtistine Fava  Pre-Procedure History & Physical: HPI:  Jacob Young is a 79 y.o. male here for cataract surgery.   Past Medical History:  Diagnosis Date   Arthritis    back    Cancer [facility])    prostate cancer    Diabetes mellitus without complication (HCC)    Dysrhythmia    irregular heart beat per patient    History of kidney stones    Hypertension     Past Surgical History:  Procedure Laterality Date   CARDIAC CATHETERIZATION     2009   COLONOSCOPY     LYMPHADENECTOMY Bilateral 06/05/2013   Procedure: LYMPHADENECTOMY;  Surgeon: Gretel Ferrara, MD;  Location: [facility]S;  Service: Urology;  Laterality: Bilateral;   ROBOT ASSISTED LAPAROSCOPIC RADICAL PROSTATECTOMY N/A 06/05/2013   Procedure: ROBOTIC ASSISTED LAPAROSCOPIC RADICAL PROSTATECTOMY LEVEL 2;  Surgeon: Gretel Ferrara, MD;  Location: [facility];  Service: Urology;  Laterality: N/A;    Prior to Admission medications  Medication Sig Start Date End Date Taking? Authorizing Provider  ezetimibe -simvastatin  (VYTORIN ) 10-40 MG per tablet Take 1 tablet by mouth at bedtime.   Yes [provider]  insulin  glargine (LANTUS) 100 UNIT/ML injection Inject 20 Units into the skin at bedtime.   Yes [provider]  insulin  lispro (HUMALOG KWIKPEN) 100 UNIT/ML KwikPen Inject 15 Units into the skin 3 (three) times daily.   Yes [provider]  lamoTRIgine (LAMICTAL) 100 MG tablet Take 100 mg by mouth daily.   Yes [provider]  losartan (COZAAR) 25 MG tablet Take 25 mg by mouth daily.   Yes [provider]  metoprolol  succinate (TOPROL -XL) 25 MG 24 hr tablet Take 25 mg by mouth 2 (two) times daily. Patient taking differently: Take 50 mg by mouth daily.   Yes [provider]  PARoxetine  (PAXIL ) 40 MG tablet Take 40 mg by mouth every morning.   Yes [provider]  rosuvastatin (CRESTOR) 40 MG  tablet Take 40 mg by mouth daily.   Yes [provider]  traZODone (DESYREL) 50 MG tablet Take 50 mg by mouth at bedtime.   Yes [provider]    Allergies as of 12/04/2023   (No Known Allergies)    History reviewed. No pertinent family history.  Social History   Socioeconomic History   Marital status: Married    Spouse name: Not on file   Number of children: Not on file   Years of education: Not on file   Highest education level: Not on file  Occupational History   Not on file  Tobacco Use   Smoking status: Former    Current packs/day: 0.00    Types: Cigarettes    Quit date: 01/17/1979    Years since quitting: 45.0   Smokeless tobacco: Former    Types: Chew    Quit date: 01/17/1979  Vaping Use   Vaping status: Never Used  Substance and Sexual Activity   Alcohol use: Not Currently   Drug use: Never   Sexual activity: Not on file  Other Topics Concern   Not on file  Social History Narrative   Not on file   Social Drivers of Health   Tobacco Use: Medium Risk (01/31/2024)   Patient History    Smoking Tobacco Use: Former    Smokeless Tobacco Use: Former    Passive Exposure: Not on Actuary Strain: Medium Risk (07/18/2023)  Received from [facility]   Overall Financial Resource Strain (CARDIA)    Difficulty of Paying Living Expenses: Somewhat hard  Food Insecurity: Food Insecurity Present (07/18/2023)   Received from [facility]   Epic    Within the past 12 months, you worried that your food would run out before you got the money to buy more.: Sometimes true    Within the past 12 months, the food you bought just didn't last and you didn't have money to get more.: Sometimes true  Transportation Needs: No Transportation Needs (07/18/2023)   Received from [facility] - Transportation    In the past 12 months, has lack of transportation kept you from medical appointments or from  getting medications?: No    Lack of Transportation (Non-Medical): No  Physical Activity: Not on file  Stress: Not on file  Social Connections: Not on file  Intimate Partner Violence: Not on file  Depression (EYV7-0): Not on file  Alcohol Screen: Not on file  Housing: Low Risk  (07/18/2023)   Received from [facility]   Epic    In the last 12 months, was there a time when you were not able to pay the mortgage or rent on time?: No    In the past 12 months, how many times have you moved where you were living?: 0    At any time in the past 12 months, were you homeless or living in a shelter (including now)?: No  Utilities: Not At Risk (07/18/2023)   Received from [facility]   Epic    In the past 12 months has the electric, gas, oil, or water  company threatened to shut off services in your home?: No  Health Literacy: Not on file    Review of Systems: See HPI, otherwise negative ROS  Physical Exam: BP (!) 148/57   Temp (!) 97.1 F (36.2 C) (Temporal)   Resp 10   Ht 5' 7.01 (1.702 m)   Wt 68.7 kg   SpO2 96%   BMI 23.71 kg/m  General:   Alert, cooperative in NAD Head:  Normocephalic and atraumatic. Respiratory:  Normal work of breathing. Cardiovascular:  RRR  Impression/Plan: [name] is here for cataract surgery RIGHT EYE.  Risks, benefits, limitations, and alternatives regarding cataract surgery have been reviewed with the patient.  Questions have been answered.  All parties agreeable.   Curtistine JINNY Fava, MD  01/31/2024, 10:16 AM

## 2024-01-31 NOTE — Op Note (Signed)
 PREOPERATIVE DIAGNOSIS:  Nuclear sclerotic cataract of the right eye.   POSTOPERATIVE DIAGNOSIS:  Right Eye Cataract   OPERATIVE PROCEDURE: Phacoemulsification with IOL Implant Right Eye   SURGEON:  Curtistine Fava, MD   ANESTHESIA:  Anesthesiologist: Vicci Camellia Glatter, MD CRNA: [place], [place], CRNA  Monitored anesthesia care. Topical tetracaine  drops followed by 2% Xylocaine  jelly applied in the preoperative holding area 0.31ml of epi-Shugarcaine was instilled in the eye following the paracentesis. Omidria  solution was used in the irrigation solution during the case   COMPLICATIONS:  None.   TECHNIQUE:   Phacoemulsification divide and conquer   DESCRIPTION OF PROCEDURE:  The patient was examined and consented in the preoperative holding area where the aforementioned topical anesthesia was applied to the right eye and then brought back to the Operating Room where the right eye was prepped and draped in the usual sterile ophthalmic fashion and a lid speculum was placed. A paracentesis was created with the side port blade and the anterior chamber was filled with epi-Shugarcaine followed by trypan blue  then by viscoelastic. A clear corneal incision was performed with the steel keratome. A continuous curvilinear capsulorrhexis was performed with a cystotome followed by the capsulorrhexis forceps. Hydrodissection and hydrodelineation were carried out with BSS on a blunt cannula. The lens was removed in a divide and conquer technique and the remaining cortical material was removed with the irrigation-aspiration handpiece. The capsular bag was inflated with viscoelastic and the +[id number] lens was placed in the capsular bag without complication. The remaining viscoelastic was removed from the eye with the irrigation-aspiration handpiece. The wounds were hydrated. The anterior chamber was flushed with BSS and the eye was inflated to physiologic pressure. 0.83ml of Vigamox  was placed in the anterior  chamber. The wounds were found to be water  tight. The eye was dressed with Combigan  and covered with a clear shield to be worn until the first postoperative day appointment. The patient was given protective glasses to wear throughout the day. The patient was also given drops with which to begin a drop regimen today and will follow-up with me in one day. Implant Name Type Inv. Item Serial No. Manufacturer Lot No. LRB No. Used Action  LENS IOL CLRN CLEARN 21.5 - [id number] Intraocular Lens LENS IOL CLRN CLEARN 21.5 [id number] [employer]  Right 1 Implanted   Procedures: PHACOEMULSIFICATION, CATARACT, WITH IOL INSERTION 17017, 02:25.7 (Right)  Electronically signed: Curtistine PARAS [facility] 01/31/2024 10:58 AM

## 2024-01-31 NOTE — Anesthesia Preprocedure Evaluation (Addendum)
 "                                  Anesthesia Evaluation  Patient identified by MRN, date of birth, ID band Patient awake    Reviewed: Allergy & Precautions, H&P , NPO status , Patient's Chart, lab work & pertinent test results  Airway Mallampati: II  TM Distance: >3 FB Neck ROM: full    Dental  (+) Lower Dentures, Upper Dentures   Pulmonary former smoker 62.8 pack-year smoking history.   Pulmonary exam normal        Cardiovascular hypertension, Normal cardiovascular exam+ dysrhythmias (Ventricular premature depolarization)   MPS 7/25: Gated post-stress perfusion imaging was performed 30 minutes after stress.  Rest images were performed 30 minutes after injection.   Gated LV Analysis:   Summary of LV Perfusion: Normal,   Summary of LV Function: Equivocal    TID Ratio: 1.26   LVEF= 47%   FINDINGS:  Regional wall motion:  demonstrates  hypokinesis of the global walls.  The overall quality of the study is good.   Artifacts noted: no  Left ventricular cavity: normal.   Perfusion Analysis:  SPECT images demonstrate homogeneous tracer  distribution throughout the myocardium. Defect type : Normal       Neuro/Psych negative neurological ROS  negative psych ROS   GI/Hepatic negative GI ROS, Neg liver ROS,,,  Endo/Other  diabetes, Type 2    Renal/GU      Musculoskeletal  (+) Arthritis ,    Abdominal Normal abdominal exam  (+)   Peds  Hematology negative hematology ROS (+)   Anesthesia Other Findings Ambulates with walker  Past Medical History: No date: Arthritis     Comment:  back  No date: Cancer (HCC)     Comment:  prostate cancer  No date: Diabetes mellitus without complication (HCC) No date: Dysrhythmia     Comment:  irregular heart beat per patient  No date: History of kidney stones No date: Hypertension  Past Surgical History: No date: CARDIAC CATHETERIZATION     Comment:  2009 No date: COLONOSCOPY 06/05/2013: LYMPHADENECTOMY;  Bilateral     Comment:  Procedure: LYMPHADENECTOMY;  Surgeon: Gretel Ferrara, MD;              Location: [facility];  Service: Urology;  Laterality:               Bilateral; 06/05/2013: ROBOT ASSISTED LAPAROSCOPIC RADICAL PROSTATECTOMY; N/A     Comment:  Procedure: ROBOTIC ASSISTED LAPAROSCOPIC RADICAL               PROSTATECTOMY LEVEL 2;  Surgeon: Gretel Ferrara, MD;                Location: [facility];  Service: Urology;  Laterality: N/A;  BMI    Body Mass Index: 25.84 kg/m      Reproductive/Obstetrics negative OB ROS                              Anesthesia Physical Anesthesia Plan  ASA: 3  Anesthesia Plan: MAC   Post-op Pain Management:    Induction:   PONV Risk Score and Plan:   Airway Management Planned:   Additional Equipment:   Intra-op Plan:   Post-operative Plan:   Informed Consent: I have reviewed the patients History and Physical, chart, labs and discussed the procedure including the  risks, benefits and alternatives for the proposed anesthesia with the patient or authorized representative who has indicated his/her understanding and acceptance.       Plan Discussed with: Anesthesiologist, CRNA and Surgeon  Anesthesia Plan Comments:          Anesthesia Quick Evaluation  "

## 2024-01-31 NOTE — Anesthesia Postprocedure Evaluation (Signed)
"   Anesthesia Post Note  Patient: Jacob Young  Procedure(s) Performed: PHACOEMULSIFICATION, CATARACT, WITH IOL INSERTION 17017, 02:25.7 (Right)  Patient location during evaluation: PACU Anesthesia Type: MAC Level of consciousness: awake and alert Pain management: pain level controlled Vital Signs Assessment: post-procedure vital signs reviewed and stable Respiratory status: spontaneous breathing, nonlabored ventilation and respiratory function stable Cardiovascular status: stable and blood pressure returned to baseline Postop Assessment: no apparent nausea or vomiting Anesthetic complications: no   No notable events documented.   Last Vitals:  Vitals:   01/31/24 1106 01/31/24 1111  BP:    Pulse: (!) 41 (!) 42  Resp: 13 15  Temp:    SpO2: 96% 94%    Last Pain:  Vitals:   01/31/24 1106  TempSrc:   PainSc: 0-No pain                 Camellia Merilee Louder      "

## 2024-02-12 NOTE — Discharge Instructions (Signed)

## 2024-02-14 ENCOUNTER — Encounter: Admission: RE | Disposition: A | Discharge status: Home / Self Care | Payer: Self-pay | Source: Home / Self Care

## 2024-02-14 ENCOUNTER — Ambulatory Visit: Payer: Self-pay | Admitting: Anesthesiology

## 2024-02-14 ENCOUNTER — Other Ambulatory Visit: Payer: Self-pay

## 2024-02-14 ENCOUNTER — Ambulatory Visit: Admission: RE | Admit: 2024-02-14 | Discharge: 2024-02-14 | Disposition: A | Discharge status: Home / Self Care

## 2024-02-14 DIAGNOSIS — Z79899 Other long term (current) drug therapy: Secondary | ICD-10-CM | POA: Insufficient documentation

## 2024-02-14 DIAGNOSIS — E1136 Type 2 diabetes mellitus with diabetic cataract: Secondary | ICD-10-CM | POA: Diagnosis not present

## 2024-02-14 DIAGNOSIS — M21379 Foot drop, unspecified foot: Secondary | ICD-10-CM | POA: Diagnosis not present

## 2024-02-14 DIAGNOSIS — Z87891 Personal history of nicotine dependence: Secondary | ICD-10-CM | POA: Insufficient documentation

## 2024-02-14 DIAGNOSIS — Z9079 Acquired absence of other genital organ(s): Secondary | ICD-10-CM | POA: Insufficient documentation

## 2024-02-14 DIAGNOSIS — I10 Essential (primary) hypertension: Secondary | ICD-10-CM | POA: Diagnosis not present

## 2024-02-14 DIAGNOSIS — I4510 Unspecified right bundle-branch block: Secondary | ICD-10-CM | POA: Insufficient documentation

## 2024-02-14 DIAGNOSIS — Z794 Long term (current) use of insulin: Secondary | ICD-10-CM | POA: Insufficient documentation

## 2024-02-14 DIAGNOSIS — M1990 Unspecified osteoarthritis, unspecified site: Secondary | ICD-10-CM | POA: Insufficient documentation

## 2024-02-14 DIAGNOSIS — H2512 Age-related nuclear cataract, left eye: Secondary | ICD-10-CM | POA: Insufficient documentation

## 2024-02-14 LAB — GLUCOSE, CAPILLARY: Glucose-Capillary: 207 mg/dL — ABNORMAL HIGH (ref 70–99)

## 2024-02-14 MED ORDER — SIGHTPATH DOSE#1 BSS IO SOLN
INTRAOCULAR | Status: DC | PRN
  Administered 2024-02-14: 15 mL via INTRAOCULAR

## 2024-02-14 MED ORDER — MIDAZOLAM HCL 2 MG/2ML IJ SOLN
INTRAMUSCULAR | Status: AC
  Filled 2024-02-14: qty 2

## 2024-02-14 MED ORDER — FENTANYL CITRATE (PF) 100 MCG/2ML IJ SOLN
INTRAMUSCULAR | Status: AC
  Filled 2024-02-14: qty 2

## 2024-02-14 MED ORDER — SEVOFLURANE IN SOLN
RESPIRATORY_TRACT | Status: AC
  Filled 2024-02-14: qty 250

## 2024-02-14 MED ORDER — BRIMONIDINE TARTRATE-TIMOLOL 0.2-0.5 % OP SOLN
OPHTHALMIC | Status: DC | PRN
  Administered 2024-02-14: 1 [drp] via OPHTHALMIC

## 2024-02-14 MED ORDER — TETRACAINE HCL 0.5 % OP SOLN
OPHTHALMIC | Status: AC
  Filled 2024-02-14: qty 4

## 2024-02-14 MED ORDER — GLYCOPYRROLATE 0.2 MG/ML IJ SOLN
INTRAMUSCULAR | Status: AC
  Filled 2024-02-14: qty 1

## 2024-02-14 MED ORDER — CYCLOPENTOLATE HCL 2 % OP SOLN
OPHTHALMIC | Status: AC
  Filled 2024-02-14: qty 2

## 2024-02-14 MED ORDER — LACTATED RINGERS IV SOLN: INTRAVENOUS | Status: DC

## 2024-02-14 MED ORDER — GLYCOPYRROLATE 0.2 MG/ML IJ SOLN
INTRAMUSCULAR | Status: DC | PRN
  Administered 2024-02-14 (×2): .1 mg via INTRAVENOUS

## 2024-02-14 MED ORDER — PHENYLEPHRINE HCL 10 % OP SOLN
OPHTHALMIC | Status: AC
  Filled 2024-02-14: qty 5

## 2024-02-14 MED ORDER — MOXIFLOXACIN HCL 0.5 % OP SOLN
OPHTHALMIC | Status: DC | PRN
  Administered 2024-02-14: .2 mL via OPHTHALMIC

## 2024-02-14 MED ORDER — PHENYLEPHRINE HCL 10 % OP SOLN
1.0000 [drp] | OPHTHALMIC | Status: AC | PRN
  Administered 2024-02-14 (×3): 1 [drp] via OPHTHALMIC

## 2024-02-14 MED ORDER — FENTANYL CITRATE (PF) 100 MCG/2ML IJ SOLN
INTRAMUSCULAR | Status: DC | PRN
  Administered 2024-02-14 (×2): 50 ug via INTRAVENOUS

## 2024-02-14 MED ORDER — PHENYLEPHRINE-KETOROLAC 1-0.3 % IO SOLN
INTRAOCULAR | Status: DC | PRN
  Administered 2024-02-14: 164 mL via OPHTHALMIC

## 2024-02-14 MED ORDER — LIDOCAINE HCL (PF) 2 % IJ SOLN
INTRAOCULAR | Status: DC | PRN
  Administered 2024-02-14: 4 mL via INTRAOCULAR

## 2024-02-14 MED ORDER — CYCLOPENTOLATE HCL 2 % OP SOLN
1.0000 [drp] | OPHTHALMIC | Status: AC | PRN
  Administered 2024-02-14 (×3): 1 [drp] via OPHTHALMIC

## 2024-02-14 MED ORDER — MIDAZOLAM HCL (PF) 2 MG/2ML IJ SOLN
INTRAMUSCULAR | Status: DC | PRN
  Administered 2024-02-14 (×2): 1 mg via INTRAVENOUS

## 2024-02-14 MED ORDER — TETRACAINE HCL 0.5 % OP SOLN
1.0000 [drp] | OPHTHALMIC | Status: DC | PRN
  Administered 2024-02-14 (×3): 1 [drp] via OPHTHALMIC

## 2024-02-14 MED ORDER — SIGHTPATH DOSE#1 NA HYALUR & NA CHOND-NA HYALUR IO KIT
PACK | INTRAOCULAR | Status: DC | PRN
  Administered 2024-02-14: 1 via OPHTHALMIC

## 2024-02-14 NOTE — Transfer of Care (Signed)
 Immediate Anesthesia Transfer of Care Note  Patient: Kenniel Bergsma  Procedure(s) Performed: PHACOEMULSIFICATION, CATARACT, WITH IOL INSERTION 24.25 02:46.5 (Left: Eye)  Patient Location: PACU  Anesthesia Type: MAC  Level of Consciousness: awake, alert  and patient cooperative  Airway and Oxygen Therapy: Patient Spontanous Breathing and Patient connected to supplemental oxygen  Post-op Assessment: Post-op Vital signs reviewed, Patient's Cardiovascular Status Stable, Respiratory Function Stable, Patent Airway and No signs of Nausea or vomiting  Post-op Vital Signs: Reviewed and stable  Complications: No notable events documented.

## 2024-02-14 NOTE — Anesthesia Postprocedure Evaluation (Signed)
"   Anesthesia Post Note  Patient: Jacob Young  Procedure(s) Performed: PHACOEMULSIFICATION, CATARACT, WITH IOL INSERTION 24.25 02:46.5 (Left: Eye)  Patient location during evaluation: Phase II Anesthesia Type: MAC Level of consciousness: awake, oriented and awake and alert Pain management: satisfactory to patient Vital Signs Assessment: post-procedure vital signs reviewed and stable Respiratory status: spontaneous breathing Cardiovascular status: blood pressure returned to baseline Anesthetic complications: no   No notable events documented.   Last Vitals:  Vitals:   02/14/24 0940 02/14/24 0945  BP: (!) 147/78 127/66  Pulse: 70 70  Resp: 11 14  Temp:  36.6 C  SpO2: 96% 96%    Last Pain:  Vitals:   02/14/24 0945  PainSc: 0-No pain                 Kalix Meinecke GORMAN Balloon      "

## 2024-02-14 NOTE — Op Note (Signed)
 PREOPERATIVE DIAGNOSIS:  Nuclear sclerotic cataract of the left eye.   POSTOPERATIVE DIAGNOSIS:  Nuclear sclerotic cataract of the left eye.   OPERATIVE PROCEDURE: Phacoemulsification with IOL Implant Left Eye   SURGEON:  Curtistine Fava, MD   ANESTHESIA:  Anesthesiologist: Quin Annabella RAMAN, MD CRNA: Liliane Ports, CRNA  1.      Managed anesthesia care. 2.     0.30ml of epi-Shugarcaine was instilled following the paracentesis   COMPLICATIONS:  None.   TECHNIQUE:   Phacoemulsification divide and conquer   DESCRIPTION OF PROCEDURE:  The patient was examined and consented in the preoperative holding area where the aforementioned topical anesthesia was applied to the left eye and then brought back to the Operating Room where the left eye was prepped and draped in the usual sterile ophthalmic fashion and a lid speculum was placed. A paracentesis was created with the side port blade and the anterior chamber was filled with epi-Shugarcaine followed by trypan blue  then by viscoelastic. A clear corneal incision was performed with the steel keratome. A continuous curvilinear capsulorrhexis was performed with a cystotome followed by the capsulorrhexis forceps. Hydrodissection and hydrodelineation were carried out with BSS on a blunt cannula.The lens was removed in a divide and conquer technique and the remaining cortical material was removed with the irrigation-aspiration handpiece. The capsular bag was inflated with viscoelastic and the +21.50 CC60WF lens was placed in the capsular bag without complication. The remaining viscoelastic was removed from the eye with the irrigation-aspiration handpiece. The wounds were hydrated. The anterior chamber was flushed with BSS and the eye was inflated to physiologic pressure. 0.1ml of Vigamox  was placed in the anterior chamber. The wounds were found to be water  tight. The eye was dressed with Combigan  and covered with a clear shield to be worn until the first  postoperative day appointment. The patient was given protective glasses to wear throughout the day. The patient was also given drops with which to begin a drop regimen today and will follow-up with me in one day. Implant Name Type Inv. Item Serial No. Manufacturer Lot No. LRB No. Used Action  LENS IOL CLRN CLEARN 21.5 - [id number] Intraocular Lens LENS IOL CLRN CLEARN 21.5 [id number] [employer]  Left 1 Implanted    Procedures: PHACOEMULSIFICATION, CATARACT, WITH IOL INSERTION 24.25 02:46.5 (Left)  Electronically signed: Curtistine PARAS [facility] 02/14/2024 9:37 AM

## 2024-02-14 NOTE — H&P (Signed)
 [facility]   Primary Care Physician:  Christi Vannie PARAS, MD Ophthalmologist: Dr. Curtistine Fava  Pre-Procedure History & Physical: HPI:  Jarnell Cordaro is a 79 y.o. male here for cataract surgery.   Past Medical History:  Diagnosis Date   Arthritis    back    Cancer [facility])    prostate cancer    Diabetes mellitus without complication (HCC)    Dysrhythmia    irregular heart beat per patient    History of kidney stones    Hypertension     Past Surgical History:  Procedure Laterality Date   CARDIAC CATHETERIZATION     2009   CATARACT EXTRACTION W/PHACO Right 01/31/2024   Procedure: PHACOEMULSIFICATION, CATARACT, WITH IOL INSERTION 17017, 02:25.7;  Surgeon: Fava Curtistine PARAS, MD;  Location: [facility];  Service: Ophthalmology;  Laterality: Right;   COLONOSCOPY     LYMPHADENECTOMY Bilateral 06/05/2013   Procedure: LYMPHADENECTOMY;  Surgeon: Gretel Ferrara, MD;  Location: [facility];  Service: Urology;  Laterality: Bilateral;   ROBOT ASSISTED LAPAROSCOPIC RADICAL PROSTATECTOMY N/A 06/05/2013   Procedure: ROBOTIC ASSISTED LAPAROSCOPIC RADICAL PROSTATECTOMY LEVEL 2;  Surgeon: Gretel Ferrara, MD;  Location: [facility];  Service: Urology;  Laterality: N/A;    Prior to Admission medications  Medication Sig Start Date End Date Taking? Authorizing Provider  ezetimibe -simvastatin  (VYTORIN ) 10-40 MG per tablet Take 1 tablet by mouth at bedtime.   Yes [provider]  insulin  glargine (LANTUS) 100 UNIT/ML injection Inject 20 Units into the skin at bedtime.   Yes [provider]  insulin  lispro (HUMALOG KWIKPEN) 100 UNIT/ML KwikPen Inject 15 Units into the skin 3 (three) times daily.   Yes [provider]  lamoTRIgine (LAMICTAL) 100 MG tablet Take 100 mg by mouth daily.   Yes [provider]  losartan (COZAAR) 25 MG tablet Take 25 mg by mouth daily.   Yes [provider]  metoprolol  succinate (TOPROL -XL) 25 MG 24 hr tablet Take 25 mg by mouth 2  (two) times daily. Patient taking differently: Take 50 mg by mouth daily.   Yes [provider]  PARoxetine  (PAXIL ) 40 MG tablet Take 40 mg by mouth every morning.   Yes [provider]  rosuvastatin (CRESTOR) 40 MG tablet Take 40 mg by mouth daily.   Yes [provider]  traZODone (DESYREL) 50 MG tablet Take 50 mg by mouth at bedtime.   Yes [provider]    Allergies as of 12/04/2023   (No Known Allergies)    History reviewed. No pertinent family history.  Social History   Socioeconomic History   Marital status: Married    Spouse name: Not on file   Number of children: Not on file   Years of education: Not on file   Highest education level: Not on file  Occupational History   Not on file  Tobacco Use   Smoking status: Former    Current packs/day: 0.00    Types: Cigarettes    Quit date: 01/17/1979    Years since quitting: 45.1   Smokeless tobacco: Former    Types: Chew    Quit date: 01/17/1979  Vaping Use   Vaping status: Never Used  Substance and Sexual Activity   Alcohol use: Not Currently   Drug use: Never   Sexual activity: Not on file  Other Topics Concern   Not on file  Social History Narrative   Not on file   Social Drivers of Health   Tobacco Use: Medium Risk (02/14/2024)  Patient History    Smoking Tobacco Use: Former    Smokeless Tobacco Use: Former    Passive Exposure: Not on Stage Manager: Low Risk  (02/05/2024)   Received from [facility]   Overall Financial Resource Strain (CARDIA)    Difficulty of Paying Living Expenses: Not very hard  Food Insecurity: No Food Insecurity (02/05/2024)   [facility] from [facility]   Epic    Within the past 12 months, you worried that your food would run out before you got the money to buy more.: Never true    Within the past 12 months, the food you bought just didn't last and you didn't have money to get more.: Never true   Transportation Needs: No Transportation Needs (02/05/2024)   Received from [facility]    In the past 12 months, has lack of transportation kept you from medical appointments or from getting medications?: No    Lack of Transportation (Non-Medical): No  Physical Activity: Not on file  Stress: Not on file  Social Connections: Not on file  Intimate Partner Violence: Not on file  Depression (EYV7-0): Not on file  Alcohol Screen: Not on file  Housing: Low Risk  (02/05/2024)   Received from [facility]   Epic    In the last 12 months, was there a time when you were not able to pay the mortgage or rent on time?: No    In the past 12 months, how many times have you moved where you were living?: 0    At any time in the past 12 months, were you homeless or living in a shelter (including now)?: No  Utilities: Not At Risk (02/05/2024)   Received from [facility]   Epic    In the past 12 months has the electric, gas, oil, or water  company threatened to shut off services in your home?: No  Health Literacy: Not on file    Review of Systems: See HPI, otherwise negative ROS  Physical Exam: BP (!) 173/62   Pulse (!) 43   Resp 18   Ht 5' 7.01 (1.702 m)   Wt 68.5 kg   SpO2 100%   BMI 23.66 kg/m  General:   Alert, cooperative in NAD Head:  Normocephalic and atraumatic. Respiratory:  Normal work of breathing. Cardiovascular:  RRR  Impression/Plan: Cheryl Stabenow is here for cataract surgery LEFT EYE.  Risks, benefits, limitations, and alternatives regarding cataract surgery have been reviewed with the patient.  Questions have been answered.  All parties agreeable.   Curtistine JINNY Fava, MD  02/14/2024, 8:52 AM

## 2024-02-14 NOTE — Anesthesia Preprocedure Evaluation (Addendum)
 "                                  Anesthesia Evaluation  Patient identified by MRN, date of birth, ID band Patient awake    Reviewed: Allergy & Precautions, NPO status , Patient's Chart, lab work & pertinent test results, reviewed documented beta blocker date and time   Airway Mallampati: II       Dental  (+) Missing   Pulmonary former smoker   Pulmonary exam normal        Cardiovascular Exercise Tolerance: Good hypertension, Pt. on home beta blockers and Pt. on medications Normal cardiovascular exam+ dysrhythmias   Patient presents with ECG strip with HR in the 30s EKG performed today  SB @46bpm  RBBB LAFB ( bifasicular block) no ST changes   Patient asmptomatic, unsure if he took 50 or 75mg  of Metoprolol  this am. Exercise tolerance good, last saw cardiologist 10/2023 told him he was good for a year 07/2023 1 episode of CP  Echo:   STUDY: ECHO COMPLETE                             SOUND QLTY: Moderate                       ECHO: Yes                                           STRAIN: Yes                           COLOR: Yes                                               3D: No                          DOPPLER: Yes                                               BP: 110 / 62                  RV BIOPSY: No                                                HR: 76 BPM                     CONTRAST: No                                            Height: 71 in  MEDIUM: N/A                                           Weight: 157 lbs                     MACHINE: [place] - EpiQ-1                                 BSA: 1.9                     ------------------------------------------------------------------------------------------     History: Chest Pain and SOB      Reason: Assess LV function   Indication:  R07.9- Chest pain, unspecified.                                                      R06.02- Shortness of breath.                                                           CONCLUSION -------------------------------------------------------------------------------  NORMAL LEFT VENTRICULAR SYSTOLIC FUNCTION WITH NO LVH  ESTIMATED EF: >55%  NORMAL LA PRESSURES WITH DIASTOLIC DYSFUNCTION (GRADE 1)  NORMAL RIGHT VENTRICULAR SYSTOLIC FUNCTION  VALVULAR REGURGITATION: No AR, TRIVIAL MR, TRIVIAL PR, No TR  ESTIMATED RVSP: 28 mmHg  NO VALVULAR STENOSIS   ECG 07/2023  Vent Rate (bpm) 77 PR Interval (msec) 178 QRS Interval (msec) 144 QT Interval (msec) 392 QTc (msec) 443 Resulting Agency [facility]USE RESULTS Narrative Performed by [facility] RESULTS This result has an attachment that is not available. Sinus rhythm with frequent premature ventricular complexes in a pattern of bigeminy Right bundle branch block Left anterior fascicular block Bifascicular block  Abnormal ECG     Neuro/Psych [place] uses cane    GI/Hepatic negative GI ROS, Neg liver ROS,,,  Endo/Other  diabetes  Fsglc 207  Renal/GU negative Renal ROS   S/p prostatectomy For prostate cancer    Musculoskeletal  (+) Arthritis ,    Abdominal   Peds  Hematology   Anesthesia Other Findings Patient without symptoms, no change in baseline ECG except marked sinus brady D/w pt to call cardiology and inform them HR was 30s-40s while here for procedure.  Plan to treat with Glyco or Atropine intra-op if needed  Reproductive/Obstetrics                              Anesthesia Physical Anesthesia Plan  ASA: 3  Anesthesia Plan: MAC   Post-op Pain Management:    Induction:   PONV Risk Score and Plan:   Airway Management Planned: Nasal Cannula  Additional Equipment:   Intra-op Plan:   Post-operative Plan:   Informed Consent: I have reviewed the patients History and Physical, chart, labs and discussed the procedure including the risks, benefits and alternatives for the proposed anesthesia with the patient or authorized representative  who has indicated  his/her understanding and acceptance.       Plan Discussed with:   Anesthesia Plan Comments:          Anesthesia Quick Evaluation  "
